# Patient Record
Sex: Female | Born: 1970 | Race: White | Hispanic: No | Marital: Married | Smoking: Never smoker
Health system: Southern US, Community
[De-identification: ages and names within clinical notes are randomized; demographics above are authoritative.]

## PROBLEM LIST (undated history)

## (undated) ENCOUNTER — Ambulatory Visit (HOSPITAL_COMMUNITY): Discharge status: Absent without leave | Payer: BLUE CROSS/BLUE SHIELD

## (undated) DIAGNOSIS — A64 Unspecified sexually transmitted disease: Secondary | ICD-10-CM

## (undated) DIAGNOSIS — R32 Unspecified urinary incontinence: Secondary | ICD-10-CM

## (undated) DIAGNOSIS — R011 Cardiac murmur, unspecified: Secondary | ICD-10-CM

## (undated) DIAGNOSIS — F32A Depression, unspecified: Secondary | ICD-10-CM

## (undated) DIAGNOSIS — T7840XA Allergy, unspecified, initial encounter: Secondary | ICD-10-CM

## (undated) DIAGNOSIS — R87619 Unspecified abnormal cytological findings in specimens from cervix uteri: Secondary | ICD-10-CM

## (undated) DIAGNOSIS — I10 Essential (primary) hypertension: Secondary | ICD-10-CM

## (undated) DIAGNOSIS — F329 Major depressive disorder, single episode, unspecified: Secondary | ICD-10-CM

## (undated) HISTORY — DX: Unspecified sexually transmitted disease: A64

## (undated) HISTORY — PX: INTRAUTERINE DEVICE INSERTION: SHX323

## (undated) HISTORY — PX: APPENDECTOMY: SHX54

## (undated) HISTORY — DX: Essential (primary) hypertension: I10

## (undated) HISTORY — DX: Depression, unspecified: F32.A

## (undated) HISTORY — DX: Major depressive disorder, single episode, unspecified: F32.9

## (undated) HISTORY — PX: DILATION AND CURETTAGE OF UTERUS: SHX78

## (undated) HISTORY — PX: TONSILLECTOMY AND ADENOIDECTOMY: SUR1326

## (undated) HISTORY — DX: Unspecified urinary incontinence: R32

## (undated) HISTORY — PX: BREAST EXCISIONAL BIOPSY: SUR124

## (undated) HISTORY — DX: Allergy, unspecified, initial encounter: T78.40XA

## (undated) HISTORY — DX: Cardiac murmur, unspecified: R01.1

## (undated) HISTORY — DX: Unspecified abnormal cytological findings in specimens from cervix uteri: R87.619

## (undated) HISTORY — PX: TYMPANOSTOMY TUBE PLACEMENT: SHX32

---

## 1998-04-17 HISTORY — PX: BREAST EXCISIONAL BIOPSY: SUR124

## 2007-09-27 ENCOUNTER — Ambulatory Visit (HOSPITAL_BASED_OUTPATIENT_CLINIC_OR_DEPARTMENT_OTHER): Admission: RE | Admit: 2007-09-27 | Discharge: 2007-09-27 | Payer: Self-pay | Admitting: Family Medicine

## 2007-10-05 ENCOUNTER — Ambulatory Visit: Payer: Self-pay | Admitting: Internal Medicine

## 2008-07-27 ENCOUNTER — Ambulatory Visit (HOSPITAL_COMMUNITY): Admission: EM | Admit: 2008-07-27 | Discharge: 2008-07-29 | Payer: Self-pay | Admitting: Emergency Medicine

## 2008-07-27 ENCOUNTER — Encounter (INDEPENDENT_AMBULATORY_CARE_PROVIDER_SITE_OTHER): Payer: Self-pay | Admitting: Surgery

## 2010-07-27 LAB — DIFFERENTIAL
Basophils Absolute: 0.1 10*3/uL (ref 0.0–0.1)
Basophils Relative: 1 % (ref 0–1)
Basophils Relative: 1 % (ref 0–1)
Eosinophils Absolute: 0 10*3/uL (ref 0.0–0.7)
Lymphocytes Relative: 17 % (ref 12–46)
Lymphs Abs: 1.6 10*3/uL (ref 0.7–4.0)
Monocytes Absolute: 1 10*3/uL (ref 0.1–1.0)
Monocytes Relative: 11 % (ref 3–12)
Monocytes Relative: 7 % (ref 3–12)
Neutro Abs: 10.3 10*3/uL — ABNORMAL HIGH (ref 1.7–7.7)
Neutro Abs: 6.7 10*3/uL (ref 1.7–7.7)
Neutrophils Relative %: 71 % (ref 43–77)
Neutrophils Relative %: 85 % — ABNORMAL HIGH (ref 43–77)

## 2010-07-27 LAB — CBC
Hemoglobin: 10.7 g/dL — ABNORMAL LOW (ref 12.0–15.0)
MCHC: 34.2 g/dL (ref 30.0–36.0)
MCHC: 34.5 g/dL (ref 30.0–36.0)
MCV: 90.6 fL (ref 78.0–100.0)
Platelets: 289 10*3/uL (ref 150–400)
RBC: 3.44 MIL/uL — ABNORMAL LOW (ref 3.87–5.11)
RBC: 3.49 MIL/uL — ABNORMAL LOW (ref 3.87–5.11)
WBC: 9.5 10*3/uL (ref 4.0–10.5)

## 2010-07-27 LAB — PREGNANCY, URINE: Preg Test, Ur: NEGATIVE

## 2010-07-27 LAB — HEMOGLOBIN AND HEMATOCRIT, BLOOD: Hemoglobin: 12.5 g/dL (ref 12.0–15.0)

## 2010-08-30 NOTE — Op Note (Signed)
NAMESAMAN, Sara Potter               ACCOUNT NO.:  [id number]   MEDICAL RECORD NO.:  [id number]          PATIENT TYPE:  INP   LOCATION:                               FACILITY:  [facility]   PHYSICIAN:  Thornton Park. Daphine Deutscher, MD  DATE OF BIRTH:  [birth year]   DATE OF PROCEDURE:  DATE OF DISCHARGE:                               OPERATIVE REPORT   PREOPERATIVE DIAGNOSIS:  Acute appendicitis.   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   PROCEDURE:  Laparoscopic appendectomy.   SURGEON:  Thornton Park. Daphine Deutscher, M.D.   ANESTHESIA:  General endotracheal.   DESCRIPTION OF PROCEDURE:  The patient was met in the [facility]  Emergency Room after a CT scan at [facility] demonstrated  appendicitis with a 13 mm appendix.  In talking with her husband  postoperatively, however, she has had pain for about a month and it got  worse this weekend.   She was taken to OR 1, given general anesthesia.  Preoperatively, she  received Mefoxin.  The abdomen was prepped and draped sterilely.  I  entered the abdomen, going into the umbilicus without difficulty.  Hasson cannula was used and then a 5 mm was placed in the right upper  quadrant and a 11 in the left lower quadrant placed very obliquely.  The  appendix was quite unrecognizable and looked to be coming off and  twisting around and was extraperitoneal in some ways.  I identified the  tip and teased it away and identified the terminal ileum, used those as  landmarks, and then gradually worked my way through this inflamed  mesentery using a harmonic until I got down and realized that the stump  of the appendix had ruptured and was basically in 2.  I went ahead and  fired the stapler to resect it out but realized that the stump was  actually more proximal and it had basically ruptured at the base.  I,  therefore, resected the base of the appendix with 2 applications of the  Endo-GIA using blue loads.  Bleeding was  controlled.  I put the piece of  the appendix in a bag and brought them out and went in and washed and  irrigated.  No bleeding was noted.  I went ahead and placed a drain in  the pelvis and along the right gutter.  I removed most of my irrigant  and then closed the umbilical port with laparoscopic vision with 3  sutures of 0 Vicryl and used 4-0 Vicryl in the left lower quadrant and  then I injected with some Marcaine.  The patient tolerated the procedure  well and was taken to the recovery room in satisfactory condition.      Thornton Park Daphine Deutscher, MD  Electronically Signed    MBM/MEDQ  D:  07/27/2008  T:  07/27/2008  Job:  [id number]   cc:   Tammy R. Collins Scotland, M.D.  Fax: [phone]

## 2010-08-30 NOTE — Procedures (Signed)
Sara Potter, Sara Potter NO.:  [id number]   MEDICAL RECORD NO.:  [id number]          PATIENT TYPE:  OUT   LOCATION:  SLEEP CENTER                 FACILITY:  [facility]   PHYSICIAN:  Clinton D. Maple Hudson, MD, FCCP, FACPDATE OF BIRTH:  05-Oct-1969   DATE OF STUDY:  09/27/2007                            NOCTURNAL POLYSOMNOGRAM   REFERRING PHYSICIAN:  Tammy R. Collins Scotland, M.D.   INDICATION FOR STUDY:  Hypersomnia with sleep apnea.   EPWORTH SLEEPINESS SCORE:  3/24.  BMI 25.6.  weight 140 pounds.  Height  62 inches.  Neck 13.5 inches.   MEDICATIONS:  Home medication charted and reviewed.   SLEEP ARCHITECTURE:  Total sleep time 334 minutes with sleep efficiency  84.9%.  Stage I was 1.8%, stage II 85.9%, stage III 2.8%, REM 9.4% of  total sleep time.  Sleep latency 34.5%, REM latency 221 minutes.  Awake  after sleep onset 26 minutes.  Arousal index 7.  Strattera was taken at  8:30 p.m.   RESPIRATORY DATA:  Apnea-hypopnea index (AHI) 1.6 per hour, which is  normal.  Nine events were scored, all hypopneas.  Nonpositional.   OXYGEN DATA:  Mild snoring with oxygen desaturation to a nadir of 92%.  Mean oxygen saturation through the study was 96.2% on room air.   CARDIAC DATA:  Sinus rhythm.   MOVEMENT-PARASOMNIA:  Periodic limb movement with 4 events counted,  affecting sleep, index 0.7 per hour.  The patient wore a bite guard for  bruxism.  She slept only supine and says this is typical at home.  No  bathroom trips.   IMPRESSIONS-RECOMMENDATIONS:  1. Sleep architecture was remarkable only for sustained time until      about 2:30, marked by dominant stage II pattern with only brief      sleep stage changes.  The appearance would suggest medication      effect.  Strattera can be associated with sleep disturbances      including insomnia and vivid dreams.  2. Insignificant respiratory sleep disturbance, apnea-hypopnea index      1.6 per hour, which is normal (normal range 0-5 per  hour).  Mild      snoring with oxygen desaturation to a nadir of 92%.  3. Use of bite guard against bruxism.  4. Minimal periodic limb movement, index 0.7 arousals per hour.      Clinton D. Maple Hudson, MD, [facility], FACP  Diplomate, Biomedical engineer of Sleep Medicine  Electronically Signed     CDY/MEDQ  D:  10/05/2007 09:11:13  T:  10/05/2007 16:10:96  Job:  [id number]

## 2012-08-16 DIAGNOSIS — E559 Vitamin D deficiency, unspecified: Secondary | ICD-10-CM | POA: Insufficient documentation

## 2013-04-22 ENCOUNTER — Encounter: Payer: Self-pay | Admitting: Certified Nurse Midwife

## 2013-05-06 ENCOUNTER — Ambulatory Visit (INDEPENDENT_AMBULATORY_CARE_PROVIDER_SITE_OTHER): Payer: 59 | Admitting: Certified Nurse Midwife

## 2013-05-06 ENCOUNTER — Encounter: Payer: Self-pay | Admitting: Certified Nurse Midwife

## 2013-05-06 VITALS — BP 118/68 | HR 84 | Resp 20 | Ht 61.5 in | Wt 126.0 lb

## 2013-05-06 DIAGNOSIS — Z0000 Encounter for general adult medical examination without abnormal findings: Secondary | ICD-10-CM

## 2013-05-06 DIAGNOSIS — Z01419 Encounter for gynecological examination (general) (routine) without abnormal findings: Secondary | ICD-10-CM

## 2013-05-06 LAB — POCT URINALYSIS DIPSTICK
Bilirubin, UA: NEGATIVE
Blood, UA: NEGATIVE
Glucose, UA: NEGATIVE
Ketones, UA: NEGATIVE
Leukocytes, UA: NEGATIVE
Nitrite, UA: NEGATIVE
Protein, UA: NEGATIVE
Urobilinogen, UA: NEGATIVE
pH, UA: 8

## 2013-05-06 NOTE — Progress Notes (Signed)
43 y.o. G0P0000 Married Caucasian Fe here to establishment gyn care and for annual exam.Periods occasional scant amount, with Mirena IUD. Inserted 3/13(second one) Happy with choice for contraception. Sees PCP for all labs and Vitamin D management and Hypertension. Sees MD for depression medication management. Strong family history of osteoporosis, so patient works very hard at exercise and good dietary intake of calcium. No health issues today.  Patient's last menstrual period was 02/16/2007.          Sexually active: yes  The current method of family planning is IUD.    Exercising: yes  yin yoga,weights, rowing, advanced yoga, jump rope, stair step & bike Smoker:  no  Health Maintenance: Pap:  2012 normal per patient MMG:  12/14 normal Colonoscopy:  none BMD:   none TDaP:  Unsure per patient if update? Labs: Poct urine-ph 8.0 Self breast exam: not done   reports that she has never smoked. She has never used smokeless tobacco. She reports that she drinks about 2.0 ounces of alcohol per week. She reports that she does not use illicit drugs.  Past Medical History  Diagnosis Date  . Depression   . Hypertension   . STD (sexually transmitted disease)     chlamydia age 37  . Urinary incontinence     Past Surgical History  Procedure Laterality Date  . Breast lumpectomy      age 28, age 57 (neg)  . Dilation and curettage of uterus      age 69  . Tonsillectomy and adenoidectomy    . Tympanostomy tube placement    . Appendectomy    . Intrauterine device insertion      2nd mirena inserted 3/13    Current Outpatient Prescriptions  Medication Sig Dispense Refill  . lisinopril-hydrochlorothiazide (PRINZIDE,ZESTORETIC) 10-12.5 MG per tablet daily.      . sertraline (ZOLOFT) 50 MG tablet daily.      Marland Kitchen STRATTERA 60 MG capsule daily.       No current facility-administered medications for this visit.    Family History  Problem Relation Age of Onset  . Hypertension Mother   .  Osteoporosis Mother   . Cancer Sister     ? unsure if she does  . Hypertension Sister   . Heart attack Maternal Grandfather   . Osteoporosis Maternal Aunt   . Osteoporosis Maternal Grandmother   . Osteoporosis Paternal Grandmother     ROS:  Pertinent items are noted in HPI.  Otherwise, a comprehensive ROS was negative.  Exam:   BP 118/68  Pulse 84  Resp 20  Ht 5' 1.5" (1.562 m)  Wt 126 lb (57.153 kg)  BMI 23.42 kg/m2  LMP 02/16/2007 Height: 5' 1.5" (156.2 cm)  Ht Readings from Last 3 Encounters:  05/06/13 5' 1.5" (1.562 m)    General appearance: alert, cooperative and appears stated age Head: Normocephalic, without obvious abnormality, atraumatic Neck: no adenopathy, supple, symmetrical, trachea midline and thyroid normal to inspection and palpation Lungs: clear to auscultation bilaterally Breasts: normal appearance, no masses or tenderness, No nipple retraction or dimpling, No nipple discharge or bleeding, No axillary or supraclavicular adenopathy Heart: regular rate and rhythm Abdomen: soft, non-tender; no masses,  no organomegaly Extremities: extremities normal, atraumatic, no cyanosis or edema Skin: Skin color, texture, turgor normal. No rashes or lesions Lymph nodes: Cervical, supraclavicular, and axillary nodes normal. No abnormal inguinal nodes palpated Neurologic: Grossly normal   Pelvic: External genitalia:  no lesions  Urethra:  normal appearing urethra with no masses, tenderness or lesions              Bartholin's and Skene's: normal                 Vagina: normal appearing vagina with normal color and discharge, no lesions              Cervix: normal, non tender, IUD string noted              Pap taken: yes Bimanual Exam:  Uterus:  normal size, contour, position, consistency, mobility, non-tender and anteverted              Adnexa: normal adnexa and no mass, fullness, tenderness               Rectovaginal: Confirms               Anus:  normal  sphincter tone, no lesions  A:  Well Woman with normal exam  Contraception Mirena IUD inserted 3/13  Depression on stable medication with MD  Hypertension on stable medication with MD  P:   Reviewed health and wellness pertinent to exam  Aware of IUD warning signs and symptoms, Removal due 06/2016  Pap smear as per guidelines   Mammogram yearly pap smear taken today with HPVHR  counseled on breast self exam, mammography screening, adequate intake of calcium and vitamin D, diet and exercise, Kegel's exercises  return annually or prn  An After Visit Summary was printed and given to the patient.

## 2013-05-06 NOTE — Patient Instructions (Signed)

## 2013-05-06 NOTE — Progress Notes (Signed)
Reviewed personally.  M. Suzanne Tyris Eliot, MD.  

## 2013-05-08 LAB — IPS PAP TEST WITH HPV

## 2014-05-11 ENCOUNTER — Ambulatory Visit: Payer: 59 | Admitting: Certified Nurse Midwife

## 2014-07-17 ENCOUNTER — Ambulatory Visit: Payer: Self-pay | Admitting: Certified Nurse Midwife

## 2014-08-14 DIAGNOSIS — F902 Attention-deficit hyperactivity disorder, combined type: Secondary | ICD-10-CM | POA: Insufficient documentation

## 2014-09-25 ENCOUNTER — Encounter: Payer: Self-pay | Admitting: Certified Nurse Midwife

## 2014-09-25 ENCOUNTER — Ambulatory Visit (INDEPENDENT_AMBULATORY_CARE_PROVIDER_SITE_OTHER): Payer: 59 | Admitting: Certified Nurse Midwife

## 2014-09-25 VITALS — BP 118/86 | HR 110 | Resp 16 | Ht 61.5 in | Wt 125.0 lb

## 2014-09-25 DIAGNOSIS — N63 Unspecified lump in breast: Secondary | ICD-10-CM | POA: Diagnosis not present

## 2014-09-25 DIAGNOSIS — Z01419 Encounter for gynecological examination (general) (routine) without abnormal findings: Secondary | ICD-10-CM | POA: Diagnosis not present

## 2014-09-25 DIAGNOSIS — R319 Hematuria, unspecified: Secondary | ICD-10-CM

## 2014-09-25 DIAGNOSIS — Z23 Encounter for immunization: Secondary | ICD-10-CM | POA: Diagnosis not present

## 2014-09-25 DIAGNOSIS — N6320 Unspecified lump in the left breast, unspecified quadrant: Secondary | ICD-10-CM

## 2014-09-25 LAB — POCT URINALYSIS DIPSTICK
Bilirubin, UA: NEGATIVE
Glucose, UA: NEGATIVE
Ketones, UA: NEGATIVE
Leukocytes, UA: NEGATIVE
Nitrite, UA: NEGATIVE
Protein, UA: NEGATIVE
Spec Grav, UA: 1.02
UROBILINOGEN UA: NEGATIVE
pH, UA: 6.5

## 2014-09-25 NOTE — Progress Notes (Signed)
Patient ID: Sara Potter, female   DOB: 05/26/1970, 44 y.o.   MRN: [id number] 44 y.o. G0P0000 Married  Caucasian Fe here for annual exam. Periods none with Mirena IUD. Insertion date 3/13. Happy with choice. Sees PCP for aex/labs yearly. Sees Dr. Curt Jews manages medication which is stable.  Occasional urinary leakage with increase water intake. Denies any urinary symptoms otherwise. No other health issues today.  No LMP recorded. Patient is not currently having periods (Reason: IUD).          Sexually active: Yes.    The current method of family planning is IUD.    Exercising: Yes.    biking, jump rope, strength building, yoga, weights, row machine Smoker:  no  Health Maintenance: Pap:  04-26-13 WNL NEG HR HPV MMG:  04-04-13 WNL (Care Everywhere)  [facility]  Colonoscopy:  Never BMD:   Never   TDaP:  01-2001 Labs: PCP does labs Self Breast Exams: patient does monthly self breast exam    reports that she has never smoked. She has never used smokeless tobacco. She reports that she drinks about 2.0 - 3.5 oz of alcohol per week. She reports that she does not use illicit drugs.  Past Medical History  Diagnosis Date  . Depression   . Hypertension   . STD (sexually transmitted disease)     chlamydia age 25  . Urinary incontinence     Past Surgical History  Procedure Laterality Date  . Breast lumpectomy      age 65, age 44 (neg)  . Dilation and curettage of uterus      age 13  . Tonsillectomy and adenoidectomy    . Tympanostomy tube placement    . Appendectomy    . Intrauterine device insertion      2nd mirena inserted 3/13    Current Outpatient Prescriptions  Medication Sig Dispense Refill  . STRATTERA 60 MG capsule daily.     No current facility-administered medications for this visit.    Family History  Problem Relation Age of Onset  . Hypertension Mother   . Osteoporosis Mother   . Cancer Sister     ? unsure if she does  . Hypertension Sister   . Heart attack Maternal  Grandfather   . Osteoporosis Maternal Aunt   . Osteoporosis Maternal Grandmother   . Osteoporosis Paternal Grandmother     ROS:  Pertinent items are noted in HPI.  Otherwise, a comprehensive ROS was negative.  Exam:   BP 118/86 mmHg  Pulse 110  Resp 16  Ht 5' 1.5" (1.562 m)  Wt 125 lb (56.7 kg)  BMI 23.24 kg/m2 Height: 5' 1.5" (156.2 cm) Ht Readings from Last 3 Encounters:  09/25/14 5' 1.5" (1.562 m)  05/06/13 5' 1.5" (1.562 m)    General appearance: alert, cooperative and appears stated age Head: Normocephalic, without obvious abnormality, atraumatic Neck: no adenopathy, supple, symmetrical, trachea midline and thyroid normal to inspection and palpation Lungs: clear to auscultation bilaterally Breasts: normal appearance, no masses or tenderness, No nipple retraction or dimpling, No nipple discharge or bleeding, No axillary or supraclavicular adenopathy, positive findings: small moveable mass noted at aerola edge at 1 o'clock on left breast, non tender, mobile.  Both breast have very shotty feel. Heart: regular rate and rhythm Abdomen: soft, non-tender; no masses,  no organomegaly Extremities: extremities normal, atraumatic, no cyanosis or edema Skin: Skin color, texture, turgor normal. No rashes or lesions Lymph nodes: Cervical, supraclavicular, and axillary nodes normal. No abnormal inguinal nodes palpated  Neurologic: Grossly normal   Pelvic: External genitalia:  no lesions              Urethra:  normal appearing urethra with no masses, tenderness or lesions              Bartholin's and Skene's: normal                 Vagina: normal appearing vagina with normal color and discharge, no lesions              Cervix: normal,no lesions,non tender, IUD string noted              Pap taken: No. Bimanual Exam:  Uterus:  normal size, contour, position, consistency, mobility, non-tender              Adnexa: normal adnexa and no mass, fullness, tenderness               Rectovaginal:  Confirms               Anus:  normal sphincter tone, no lesions  Chaperone present: Yes  A:  Well Woman with normal exam  Contraception MIrena IUD due for removal on 3/19  Left breast mass  R/O UTI  Immunization update   P:   Reviewed health and wellness pertinent to exam  Aware of IUD warning signs and need to advise  Discussed left breast finding and need for evaluation with Diagnostic mammogram and [place]. Patient agreeable. Will schedule.  Reviewed warning signs of UTI and need to advise.  Lab Urine micro  Requests TDAP  Pap smear not taken today.   counseled on breast self exam, mammography screening, adequate intake of calcium and vitamin D, diet and exercise, Kegel's exercises  return annually or prn  An After Visit Summary was printed and given to the patient.

## 2014-09-25 NOTE — Patient Instructions (Signed)

## 2014-09-25 NOTE — Progress Notes (Signed)
Scheduled patient for bilateral diagnostic mammogram with left breast ultrasound at [facility] on 6/24 at 3pm.

## 2014-09-26 LAB — URINALYSIS, MICROSCOPIC ONLY
BACTERIA UA: NONE SEEN
CRYSTALS: NONE SEEN
Casts: NONE SEEN
Squamous Epithelial / LPF: NONE SEEN

## 2014-09-27 NOTE — Progress Notes (Signed)
Reviewed personally.  M. Suzanne Julienne Vogler, MD.  

## 2014-10-09 ENCOUNTER — Other Ambulatory Visit: Payer: Self-pay

## 2014-10-30 ENCOUNTER — Ambulatory Visit
Admission: RE | Admit: 2014-10-30 | Discharge: 2014-10-30 | Disposition: A | Discharge status: Home / Self Care | Payer: 59 | Source: Ambulatory Visit | Attending: Certified Nurse Midwife | Admitting: Certified Nurse Midwife

## 2014-10-30 DIAGNOSIS — N6320 Unspecified lump in the left breast, unspecified quadrant: Secondary | ICD-10-CM

## 2014-11-09 ENCOUNTER — Telehealth: Payer: Self-pay | Admitting: Emergency Medicine

## 2014-11-09 NOTE — Telephone Encounter (Signed)
   Notes Recorded by Kem Boroughs, FNP on 10/30/2014 at 12:52 PM May take out of hold after MD review - but needs a follow up breast check in 1 month with Ms. Jackelyn Poling

## 2014-11-09 NOTE — Telephone Encounter (Signed)
Message left to return call to Kanita Delage at [phone].    

## 2014-11-09 NOTE — Telephone Encounter (Signed)
Patient returned call and she is given message from provider. She is agreeable to scheduling follow up breast check and scheduled for 12/04/14 at 1600.  Routing to provider for final review. Patient agreeable to disposition. Will close encounter.

## 2014-12-04 ENCOUNTER — Encounter: Payer: Self-pay | Admitting: Certified Nurse Midwife

## 2014-12-04 ENCOUNTER — Ambulatory Visit (INDEPENDENT_AMBULATORY_CARE_PROVIDER_SITE_OTHER): Payer: 59 | Admitting: Certified Nurse Midwife

## 2014-12-04 VITALS — BP 110/70 | HR 68 | Resp 16 | Ht 61.5 in | Wt 126.0 lb

## 2014-12-04 DIAGNOSIS — Z1239 Encounter for other screening for malignant neoplasm of breast: Secondary | ICD-10-CM | POA: Diagnosis not present

## 2014-12-04 NOTE — Progress Notes (Signed)
   Subjective:   44 y.o. MarriedCaucasian female presents for follow up evaluation of left breast  which on breast exam by me noted small questionable cystic feel mass on left breast. Patient had diagnostic mammogram and [place] which no abnormal findings were seen. Patient started Evening Primrose and Vitamin E after exam and she did not it again prior to imaging. She has not any masses in either breast now.   Review of Systems Pertinent items are noted in HPI.   Objective:   General appearance: alert, cooperative and appears stated age Breasts: normal appearance, no masses or tenderness, No nipple retraction or dimpling, No nipple discharge or bleeding, No axillary or supraclavicular adenopathy, no mass noted in either breast today   Assessment:   ASSESSMENT:Patient is diagnosed with normal breast exam today with probable cyst noted at last exam resolved.   Plan:   PLAN: The patient will continue SBE and advise if any changes.

## 2014-12-04 NOTE — Progress Notes (Signed)
Reviewed personally.  M. Suzanne Nimra Puccinelli, MD.  

## 2015-08-06 DIAGNOSIS — F909 Attention-deficit hyperactivity disorder, unspecified type: Secondary | ICD-10-CM | POA: Diagnosis not present

## 2015-08-27 DIAGNOSIS — F909 Attention-deficit hyperactivity disorder, unspecified type: Secondary | ICD-10-CM | POA: Diagnosis not present

## 2015-09-02 ENCOUNTER — Other Ambulatory Visit (INDEPENDENT_AMBULATORY_CARE_PROVIDER_SITE_OTHER): Payer: BLUE CROSS/BLUE SHIELD

## 2015-09-02 ENCOUNTER — Ambulatory Visit (INDEPENDENT_AMBULATORY_CARE_PROVIDER_SITE_OTHER): Payer: BLUE CROSS/BLUE SHIELD | Admitting: Internal Medicine

## 2015-09-02 ENCOUNTER — Encounter: Payer: Self-pay | Admitting: Internal Medicine

## 2015-09-02 VITALS — BP 102/74 | HR 64 | Temp 98.2°F | Resp 16 | Ht 62.0 in | Wt 125.0 lb

## 2015-09-02 DIAGNOSIS — Z0000 Encounter for general adult medical examination without abnormal findings: Secondary | ICD-10-CM

## 2015-09-02 LAB — COMPREHENSIVE METABOLIC PANEL
ALT: 13 U/L (ref 0–35)
AST: 15 U/L (ref 0–37)
Albumin: 4.6 g/dL (ref 3.5–5.2)
Alkaline Phosphatase: 39 U/L (ref 39–117)
BUN: 10 mg/dL (ref 6–23)
CHLORIDE: 106 meq/L (ref 96–112)
CO2: 25 mEq/L (ref 19–32)
Calcium: 9.5 mg/dL (ref 8.4–10.5)
Creatinine, Ser: 0.77 mg/dL (ref 0.40–1.20)
GFR: 85.97 mL/min (ref >60.00)
GLUCOSE: 89 mg/dL (ref 70–99)
POTASSIUM: 4.9 meq/L (ref 3.5–5.1)
SODIUM: 139 meq/L (ref 135–145)
Total Bilirubin: 0.6 mg/dL (ref 0.2–1.2)
Total Protein: 6.9 g/dL (ref 6.0–8.3)

## 2015-09-02 LAB — CBC WITH DIFFERENTIAL/PLATELET
BASOS PCT: 1.7 % (ref 0.0–3.0)
Basophils Absolute: 0.1 10*3/uL (ref 0.0–0.1)
EOS PCT: 0.9 % (ref 0.0–5.0)
Eosinophils Absolute: 0 10*3/uL (ref 0.0–0.7)
HCT: 40.4 % (ref 36.0–46.0)
Hemoglobin: 13.8 g/dL (ref 12.0–15.0)
LYMPHS ABS: 1.5 10*3/uL (ref 0.7–4.0)
Lymphocytes Relative: 31.1 % (ref 12.0–46.0)
MCHC: 34.2 g/dL (ref 30.0–36.0)
MCV: 89.8 fl (ref 78.0–100.0)
MONO ABS: 0.4 10*3/uL (ref 0.1–1.0)
Monocytes Relative: 7.4 % (ref 3.0–12.0)
NEUTROS ABS: 2.9 10*3/uL (ref 1.4–7.7)
NEUTROS PCT: 58.9 % (ref 43.0–77.0)
Platelets: 322 10*3/uL (ref 150.0–400.0)
RBC: 4.5 Mil/uL (ref 3.87–5.11)
RDW: 13.4 % (ref 11.5–15.5)
WBC: 4.9 10*3/uL (ref 4.0–10.5)

## 2015-09-02 LAB — LIPID PANEL
CHOLESTEROL: 183 mg/dL (ref 0–200)
HDL: 74.1 mg/dL (ref >39.00)
LDL CALC: 97 mg/dL (ref 0–99)
NONHDL: 108.87
Total CHOL/HDL Ratio: 2
Triglycerides: 59 mg/dL (ref 0.0–149.0)
VLDL: 11.8 mg/dL (ref 0.0–40.0)

## 2015-09-02 LAB — TSH: TSH: 2.24 u[IU]/mL (ref 0.35–4.50)

## 2015-09-02 NOTE — Progress Notes (Signed)
Pre visit review using our clinic review tool, if applicable. No additional management support is needed unless otherwise documented below in the visit note. 

## 2015-09-02 NOTE — Patient Instructions (Signed)
Test(s) ordered today. Your results will be released to MyChart (or called to you) after review, usually within 72hours after test completion. If any changes need to be made, you will be notified at that same time.  All other Health Maintenance issues reviewed.   All recommended immunizations and age-appropriate screenings are up-to-date or discussed.  No immunizations administered today.   Medications reviewed and updated.  No changes recommended at this time.     Health Maintenance, Female Adopting a healthy lifestyle and getting preventive care can go a long way to promote health and wellness. Talk with your health care provider about what schedule of regular examinations is right for you. This is a good chance for you to check in with your provider about disease prevention and staying healthy. In between checkups, there are plenty of things you can do on your own. Experts have done a lot of research about which lifestyle changes and preventive measures are most likely to keep you healthy. Ask your health care provider for more information. WEIGHT AND DIET  Eat a healthy diet  Be sure to include plenty of vegetables, fruits, low-fat dairy products, and lean protein.  Do not eat a lot of foods high in solid fats, added sugars, or salt.  Get regular exercise. This is one of the most important things you can do for your health.  Most adults should exercise for at least 150 minutes each week. The exercise should increase your heart rate and make you sweat (moderate-intensity exercise).  Most adults should also do strengthening exercises at least twice a week. This is in addition to the moderate-intensity exercise.  Maintain a healthy weight  Body mass index (BMI) is a measurement that can be used to identify possible weight problems. It estimates body fat based on height and weight. Your health care provider can help determine your BMI and help you achieve or maintain a healthy  weight.  For females 20 years of age and older:   A BMI below 18.5 is considered underweight.  A BMI of 18.5 to 24.9 is normal.  A BMI of 25 to 29.9 is considered overweight.  A BMI of 30 and above is considered obese.  Watch levels of cholesterol and blood lipids  You should start having your blood tested for lipids and cholesterol at 46 years of age, then have this test every 5 years.  You may need to have your cholesterol levels checked more often if:  Your lipid or cholesterol levels are high.  You are older than 46 years of age.  You are at high risk for heart disease.  CANCER SCREENING   Lung Cancer  Lung cancer screening is recommended for adults 55-80 years old who are at high risk for lung cancer because of a history of smoking.  A yearly low-dose CT scan of the lungs is recommended for people who:  Currently smoke.  Have quit within the past 15 years.  Have at least a 30-pack-year history of smoking. A pack year is smoking an average of one pack of cigarettes a day for 1 year.  Yearly screening should continue until it has been 15 years since you quit.  Yearly screening should stop if you develop a health problem that would prevent you from having lung cancer treatment.  Breast Cancer  Practice breast self-awareness. This means understanding how your breasts normally appear and feel.  It also means doing regular breast self-exams. Let your health care provider know about any changes, no   matter how small.  If you are in your 20s or 30s, you should have a clinical breast exam (CBE) by a health care provider every 1-3 years as part of a regular health exam.  If you are 40 or older, have a CBE every year. Also consider having a breast X-ray (mammogram) every year.  If you have a family history of breast cancer, talk to your health care provider about genetic screening.  If you are at high risk for breast cancer, talk to your health care provider about  having an MRI and a mammogram every year.  Breast cancer gene (BRCA) assessment is recommended for women who have family members with BRCA-related cancers. BRCA-related cancers include:  Breast.  Ovarian.  Tubal.  Peritoneal cancers.  Results of the assessment will determine the need for genetic counseling and BRCA1 and BRCA2 testing. Cervical Cancer Your health care provider may recommend that you be screened regularly for cancer of the pelvic organs (ovaries, uterus, and vagina). This screening involves a pelvic examination, including checking for microscopic changes to the surface of your cervix (Pap test). You may be encouraged to have this screening done every 3 years, beginning at age 21.  For women ages 30-65, health care providers may recommend pelvic exams and Pap testing every 3 years, or they may recommend the Pap and pelvic exam, combined with testing for human papilloma virus (HPV), every 5 years. Some types of HPV increase your risk of cervical cancer. Testing for HPV may also be done on women of any age with unclear Pap test results.  Other health care providers may not recommend any screening for nonpregnant women who are considered low risk for pelvic cancer and who do not have symptoms. Ask your health care provider if a screening pelvic exam is right for you.  If you have had past treatment for cervical cancer or a condition that could lead to cancer, you need Pap tests and screening for cancer for at least 20 years after your treatment. If Pap tests have been discontinued, your risk factors (such as having a new sexual partner) need to be reassessed to determine if screening should resume. Some women have medical problems that increase the chance of getting cervical cancer. In these cases, your health care provider may recommend more frequent screening and Pap tests. Colorectal Cancer  This type of cancer can be detected and often prevented.  Routine colorectal cancer  screening usually begins at 46 years of age and continues through 46 years of age.  Your health care provider may recommend screening at an earlier age if you have risk factors for colon cancer.  Your health care provider may also recommend using home test kits to check for hidden blood in the stool.  A small camera at the end of a tube can be used to examine your colon directly (sigmoidoscopy or colonoscopy). This is done to check for the earliest forms of colorectal cancer.  Routine screening usually begins at age 50.  Direct examination of the colon should be repeated every 5-10 years through 46 years of age. However, you may need to be screened more often if early forms of precancerous polyps or small growths are found. Skin Cancer  Check your skin from head to toe regularly.  Tell your health care provider about any new moles or changes in moles, especially if there is a change in a mole's shape or color.  Also tell your health care provider if you have a mole that   is larger than the size of a pencil eraser.  Always use sunscreen. Apply sunscreen liberally and repeatedly throughout the day.  Protect yourself by wearing long sleeves, pants, a wide-brimmed hat, and sunglasses whenever you are outside. HEART DISEASE, DIABETES, AND HIGH BLOOD PRESSURE   High blood pressure causes heart disease and increases the risk of stroke. High blood pressure is more likely to develop in:  People who have blood pressure in the high end of the normal range (130-139/85-89 mm Hg).  People who are overweight or obese.  People who are African American.  If you are 18-39 years of age, have your blood pressure checked every 3-5 years. If you are 40 years of age or older, have your blood pressure checked every year. You should have your blood pressure measured twice--once when you are at a hospital or clinic, and once when you are not at a hospital or clinic. Record the average of the two measurements.  To check your blood pressure when you are not at a hospital or clinic, you can use:  An automated blood pressure machine at a pharmacy.  A home blood pressure monitor.  If you are between 55 years and 79 years old, ask your health care provider if you should take aspirin to prevent strokes.  Have regular diabetes screenings. This involves taking a blood sample to check your fasting blood sugar level.  If you are at a normal weight and have a low risk for diabetes, have this test once every three years after 45 years of age.  If you are overweight and have a high risk for diabetes, consider being tested at a younger age or more often. PREVENTING INFECTION  Hepatitis B  If you have a higher risk for hepatitis B, you should be screened for this virus. You are considered at high risk for hepatitis B if:  You were born in a country where hepatitis B is common. Ask your health care provider which countries are considered high risk.  Your parents were born in a high-risk country, and you have not been immunized against hepatitis B (hepatitis B vaccine).  You have HIV or AIDS.  You use needles to inject street drugs.  You live with someone who has hepatitis B.  You have had sex with someone who has hepatitis B.  You get hemodialysis treatment.  You take certain medicines for conditions, including cancer, organ transplantation, and autoimmune conditions. Hepatitis C  Blood testing is recommended for:  Everyone born from 1945 through 1965.  Anyone with known risk factors for hepatitis C. Sexually transmitted infections (STIs)  You should be screened for sexually transmitted infections (STIs) including gonorrhea and chlamydia if:  You are sexually active and are younger than 46 years of age.  You are older than 46 years of age and your health care provider tells you that you are at risk for this type of infection.  Your sexual activity has changed since you were last screened and  you are at an increased risk for chlamydia or gonorrhea. Ask your health care provider if you are at risk.  If you do not have HIV, but are at risk, it may be recommended that you take a prescription medicine daily to prevent HIV infection. This is called pre-exposure prophylaxis (PrEP). You are considered at risk if:  You are sexually active and do not regularly use condoms or know the HIV status of your partner(s).  You take drugs by injection.  You are sexually active   with a partner who has HIV. Talk with your health care provider about whether you are at high risk of being infected with HIV. If you choose to begin PrEP, you should first be tested for HIV. You should then be tested every 3 months for as long as you are taking PrEP.  PREGNANCY   If you are premenopausal and you may become pregnant, ask your health care provider about preconception counseling.  If you may become pregnant, take 400 to 800 micrograms (mcg) of folic acid every day.  If you want to prevent pregnancy, talk to your health care provider about birth control (contraception). OSTEOPOROSIS AND MENOPAUSE   Osteoporosis is a disease in which the bones lose minerals and strength with aging. This can result in serious bone fractures. Your risk for osteoporosis can be identified using a bone density scan.  If you are 85 years of age or older, or if you are at risk for osteoporosis and fractures, ask your health care provider if you should be screened.  Ask your health care provider whether you should take a calcium or vitamin D supplement to lower your risk for osteoporosis.  Menopause may have certain physical symptoms and risks.  Hormone replacement therapy may reduce some of these symptoms and risks. Talk to your health care provider about whether hormone replacement therapy is right for you.  HOME CARE INSTRUCTIONS   Schedule regular health, dental, and eye exams.  Stay current with your immunizations.   Do  not use any tobacco products including cigarettes, chewing tobacco, or electronic cigarettes.  If you are pregnant, do not drink alcohol.  If you are breastfeeding, limit how much and how often you drink alcohol.  Limit alcohol intake to no more than 1 drink per day for nonpregnant women. One drink equals 12 ounces of beer, 5 ounces of wine, or 1 ounces of hard liquor.  Do not use street drugs.  Do not share needles.  Ask your health care provider for help if you need support or information about quitting drugs.  Tell your health care provider if you often feel depressed.  Tell your health care provider if you have ever been abused or do not feel safe at home.   This information is not intended to replace advice given to you by your health care provider. Make sure you discuss any questions you have with your health care provider.   Document Released: 10/17/2010 Document Revised: 04/24/2014 Document Reviewed: 03/05/2013 Elsevier Interactive Patient Education [employer].

## 2015-09-02 NOTE — Progress Notes (Signed)
Subjective:    Patient ID: Sara Potter, female    DOB: 02/05/1971, 45 y.o.   MRN: [id number]  HPI She is here to establish with a new pcp.  She is here for a physical exam.   She has no concerns or questions.   Medications and allergies reviewed with patient and updated if appropriate.  There are no active problems to display for this patient.   No current outpatient prescriptions on file prior to visit.   No current facility-administered medications on file prior to visit.    Past Medical History  Diagnosis Date  . Depression   . Hypertension   . STD (sexually transmitted disease)     chlamydia age 45  . Urinary incontinence     Past Surgical History  Procedure Laterality Date  . Breast lumpectomy      age 50, age 72 (neg)  . Dilation and curettage of uterus      age 53  . Tonsillectomy and adenoidectomy    . Tympanostomy tube placement    . Appendectomy    . Intrauterine device insertion      2nd mirena inserted 3/13    Social History   Social History  . Marital Status: Married    Spouse Name: N/A  . Number of Children: N/A  . Years of Education: N/A   Social History Main Topics  . Smoking status: Never Smoker   . Smokeless tobacco: Never Used  . Alcohol Use: 2.0 - 3.5 oz/week    4-7 drink(s) per week  . Drug Use: No  . Sexual Activity:    Partners: Female, Female    Birth Control/ Protection: IUD     Comment: married to female   Other Topics Concern  . None   Social History Narrative    Family History  Problem Relation Age of Onset  . Hypertension Mother   . Osteoporosis Mother   . Cancer Sister     ? unsure if she does  . Hypertension Sister   . Heart attack Maternal Grandfather   . Osteoporosis Maternal Aunt   . Osteoporosis Maternal Grandmother   . Osteoporosis Paternal Grandmother     Review of Systems  Constitutional: Negative for fever, chills, appetite change, fatigue and unexpected weight change.  HENT: Positive for  hearing loss (mild ).   Eyes: Negative for visual disturbance.  Respiratory: Negative for cough, shortness of breath and wheezing.   Cardiovascular: Negative for chest pain, palpitations and leg swelling.  Gastrointestinal: Negative for nausea, abdominal pain, diarrhea and constipation.       Occ gerd  Genitourinary: Negative for dysuria and hematuria.  Musculoskeletal: Negative for myalgias, back pain and arthralgias.  Skin: Negative for color change and rash.  Neurological: Negative for dizziness, light-headedness and headaches.  Psychiatric/Behavioral: Negative for dysphoric mood. The patient is not nervous/anxious.        Objective:   Filed Vitals:   09/02/15 0910  BP: 102/74  Pulse: 64  Temp: 98.2 F (36.8 C)  Resp: 16   Filed Weights   09/02/15 0910  Weight: 125 lb (56.7 kg)   Body mass index is 22.86 kg/(m^2).   Physical Exam Constitutional: She appears well-developed and well-nourished. No distress.  HENT:  Head: Normocephalic and atraumatic.  Right Ear: External ear normal. Normal ear canal and TM Left Ear: External ear normal.  Normal ear canal and TM Mouth/Throat: Oropharynx is clear and moist.  Eyes: Conjunctivae and EOM are normal.  Neck: Neck  supple. No tracheal deviation present. No thyromegaly present.  No carotid bruit  Cardiovascular: Normal rate, regular rhythm and normal heart sounds.   No murmur heard.  No edema. Pulmonary/Chest: Effort normal and breath sounds normal. No respiratory distress. She has no wheezes. She has no rales.  Breast: deferred to Gyn Abdominal: Soft. She exhibits no distension. There is no tenderness.  Lymphadenopathy: She has no cervical adenopathy.  Skin: Skin is warm and dry. She is not diaphoretic.  Psychiatric: She has a normal mood and affect. Her behavior is normal.       Assessment & Plan:   Physical exam: Screening blood work - ordered Immunizations  Up to date  Mammogram  Up to date  Gyn  Up to  date Exercise  - daily Weight  - normal BMI Skin  - no concerns Substance abuse - no abuse Reviewed vitamins she is taking - will continue daily vitamins  Follow up annually

## 2015-09-03 LAB — HIV ANTIBODY (ROUTINE TESTING W REFLEX): HIV: NONREACTIVE

## 2015-09-24 DIAGNOSIS — F909 Attention-deficit hyperactivity disorder, unspecified type: Secondary | ICD-10-CM | POA: Diagnosis not present

## 2015-10-08 ENCOUNTER — Encounter: Payer: Self-pay | Admitting: Certified Nurse Midwife

## 2015-10-08 ENCOUNTER — Ambulatory Visit (INDEPENDENT_AMBULATORY_CARE_PROVIDER_SITE_OTHER): Payer: BLUE CROSS/BLUE SHIELD | Admitting: Certified Nurse Midwife

## 2015-10-08 VITALS — BP 100/60 | HR 80 | Resp 20 | Ht 61.75 in | Wt 125.0 lb

## 2015-10-08 DIAGNOSIS — Z0000 Encounter for general adult medical examination without abnormal findings: Secondary | ICD-10-CM

## 2015-10-08 DIAGNOSIS — Z124 Encounter for screening for malignant neoplasm of cervix: Secondary | ICD-10-CM | POA: Diagnosis not present

## 2015-10-08 DIAGNOSIS — Z01419 Encounter for gynecological examination (general) (routine) without abnormal findings: Secondary | ICD-10-CM

## 2015-10-08 LAB — POCT URINALYSIS DIPSTICK
Bilirubin, UA: NEGATIVE
Blood, UA: NEGATIVE
Glucose, UA: NEGATIVE
Ketones, UA: NEGATIVE
LEUKOCYTES UA: NEGATIVE
NITRITE UA: NEGATIVE
PH UA: 5
PROTEIN UA: NEGATIVE
UROBILINOGEN UA: NEGATIVE

## 2015-10-08 NOTE — Patient Instructions (Signed)

## 2015-10-08 NOTE — Progress Notes (Signed)
45 y.o. G0P0000 Married  Caucasian Fe here for annual exam. Periods scant to none with Mirena IUD. Denies any problems or pain with IUD. Saw Dr.Stacy Burns PCP to establish care in 5/17, with lab screening, all normal( in epic). Has not noted any breast changes since [place] and screen last year. Mammogram due 7/17. No health issues today.Planning some vacation.    No LMP recorded. Patient is not currently having periods (Reason: IUD).          Sexually active: Yes.    The current method of family planning is IUD.    Exercising: Yes.    weights,strength building, yoga,rowing,bike Smoker:  no  Health Maintenance: Pap:  04-26-13 neg HPV HR neg MMG:  10-30-14 bilateral & left breast u/s category 2:neg Colonoscopy:  none BMD:   none TDaP:  2016 Shingles: no Pneumonia: no Hep C and HIV: HIV neg 2017 Labs: poct urine-neg Self breast exam: done monthly   reports that she has never smoked. She has never used smokeless tobacco. She reports that she drinks about 2.4 - 4.2 oz of alcohol per week. She reports that she does not use illicit drugs.  Past Medical History  Diagnosis Date  . Depression   . Hypertension   . STD (sexually transmitted disease)     chlamydia age 45  . Urinary incontinence     Past Surgical History  Procedure Laterality Date  . Dilation and curettage of uterus      age 45  . Tonsillectomy and adenoidectomy    . Tympanostomy tube placement    . Appendectomy    . Intrauterine device insertion      2nd mirena inserted 3/13  . Breast lumpectomy      age 45, age 45 (neg)    No current outpatient prescriptions on file.   No current facility-administered medications for this visit.    Family History  Problem Relation Age of Onset  . Hypertension Mother   . Osteoporosis Mother   . Cancer Sister     ? unsure if she does  . Hypertension Sister   . Heart attack Maternal Grandfather   . Osteoporosis Maternal Aunt   . Osteoporosis Maternal Grandmother   .  Osteoporosis Paternal Grandmother   . Osteopenia Father   . Heart disease Paternal Grandfather     ROS:  Pertinent items are noted in HPI.  Otherwise, a comprehensive ROS was negative.  Exam:   BP 100/60 mmHg  Pulse 80  Resp 20  Ht 5' 1.75" (1.568 m)  Wt 125 lb (56.7 kg)  BMI 23.06 kg/m2 Height: 5' 1.75" (156.8 cm) Ht Readings from Last 3 Encounters:  10/08/15 5' 1.75" (1.568 m)  09/02/15 5\' 2"  (1.575 m)  12/04/14 5' 1.5" (1.562 m)    General appearance: alert, cooperative and appears stated age Head: Normocephalic, without obvious abnormality, atraumatic Neck: no adenopathy, supple, symmetrical, trachea midline and thyroid normal to inspection and palpation Lungs: clear to auscultation bilaterally Breasts: normal appearance, no masses or tenderness, No nipple retraction or dimpling, No nipple discharge or bleeding, No axillary or supraclavicular adenopathy Heart: regular rate and rhythm Abdomen: soft, non-tender; no masses,  no organomegaly Extremities: extremities normal, atraumatic, no cyanosis or edema Skin: Skin color, texture, turgor normal. No rashes or lesions Lymph nodes: Cervical, supraclavicular, and axillary nodes normal. No abnormal inguinal nodes palpated Neurologic: Grossly normal   Pelvic: External genitalia:  no lesions  Urethra:  normal appearing urethra with no masses, tenderness or lesions              Bartholin's and Skene's: normal                 Vagina: normal appearing vagina with normal color and discharge, no lesions              Cervix: no cervical motion tenderness, no lesions and normal with IUD  noted inside cervical os with using pap brush to tease strings down              Pap taken: Yes.   Bimanual Exam:  Uterus:  normal size, contour, position, consistency, mobility, non-tender              Adnexa: normal adnexa and no mass, fullness, tenderness               Rectovaginal: Confirms               Anus:  normal sphincter tone, no  lesions  Chaperone present: yes  A:  Well Woman with normal exam  Contraception Mirena IUD due for removal 3/18, plans exchange with Paula Compton  PCP care with labs  P:   Reviewed health and wellness pertinent to exam  Reviewed warning signs with IUD and need to advise. Discussed calling mid 2/18 to schedule removal and reinsertion of new Mirena. Patient will plan on.  Pap smear as above with HPV reflex   counseled on breast self exam, mammography screening, adequate intake of calcium and vitamin D, diet and exercise  return annually or prn  An After Visit Summary was printed and given to the patient.

## 2015-10-08 NOTE — Progress Notes (Signed)
Reviewed personally.  M. Suzanne Ajooni Karam, MD.  

## 2015-10-11 LAB — IPS PAP TEST WITH REFLEX TO HPV

## 2015-10-18 ENCOUNTER — Other Ambulatory Visit: Payer: Self-pay | Admitting: Certified Nurse Midwife

## 2015-10-18 DIAGNOSIS — Z1231 Encounter for screening mammogram for malignant neoplasm of breast: Secondary | ICD-10-CM

## 2015-10-29 DIAGNOSIS — F900 Attention-deficit hyperactivity disorder, predominantly inattentive type: Secondary | ICD-10-CM | POA: Diagnosis not present

## 2015-11-12 ENCOUNTER — Ambulatory Visit
Admission: RE | Admit: 2015-11-12 | Discharge: 2015-11-12 | Disposition: A | Discharge status: Home / Self Care | Payer: BLUE CROSS/BLUE SHIELD | Source: Ambulatory Visit | Attending: Certified Nurse Midwife | Admitting: Certified Nurse Midwife

## 2015-11-12 DIAGNOSIS — Z1231 Encounter for screening mammogram for malignant neoplasm of breast: Secondary | ICD-10-CM

## 2015-12-10 DIAGNOSIS — F909 Attention-deficit hyperactivity disorder, unspecified type: Secondary | ICD-10-CM | POA: Diagnosis not present

## 2015-12-24 DIAGNOSIS — F909 Attention-deficit hyperactivity disorder, unspecified type: Secondary | ICD-10-CM | POA: Diagnosis not present

## 2016-01-07 DIAGNOSIS — F909 Attention-deficit hyperactivity disorder, unspecified type: Secondary | ICD-10-CM | POA: Diagnosis not present

## 2016-01-15 DIAGNOSIS — Z23 Encounter for immunization: Secondary | ICD-10-CM | POA: Diagnosis not present

## 2016-04-18 ENCOUNTER — Telehealth: Payer: Self-pay | Admitting: Certified Nurse Midwife

## 2016-04-18 DIAGNOSIS — Z30430 Encounter for insertion of intrauterine contraceptive device: Secondary | ICD-10-CM

## 2016-04-18 DIAGNOSIS — Z30432 Encounter for removal of intrauterine contraceptive device: Secondary | ICD-10-CM

## 2016-04-18 MED ORDER — MISOPROSTOL 200 MCG PO TABS: ORAL_TABLET | ORAL | 0 refills | Status: DC

## 2016-04-18 NOTE — Telephone Encounter (Signed)
Spoke with patient. Patient calling to schedule mirena IUD removal and reinsertion. Patient request a Friday appt after 3pm.  Patient scheduled 06/16/16 at 3pm with Dr. Quincy Simmonds. Reviewed and advised of Cytotec 200 mcg: Pace 1 tablet vaginal the night before procedure. Place 1 tablet vaginal the morning of the procedure. Motrin 800 mg with food and water one hour before procedure.Eat and hydrate well. Patient verbalizes understanding and is agreeable.  Orders placed for IUD removal, insertion and cytotec.  Routing to provider for final review. Patient is agreeable to disposition. Will close encounter.  Cc: Melvia Heaps, CNM; Theresia Lo

## 2016-04-18 NOTE — Telephone Encounter (Signed)
Patient is calling to schedule her IUD replacement appointment. Patient said she is due in March 2018 for replacement.

## 2016-04-18 NOTE — Telephone Encounter (Signed)
Left message to call Treyon Wymore at [phone].  

## 2016-05-29 ENCOUNTER — Telehealth: Payer: Self-pay | Admitting: Obstetrics and Gynecology

## 2016-05-29 NOTE — Telephone Encounter (Signed)
Patient requested I call with benefits for IUD removal/reinsertion scheduled 06-16-16. Call to patient to review. Left detailed voicemail per most recent DPR. Left benefits, appointment arrival date and time and 48 hour cancellation policy. Left return call information should patient need to call back.   No further action required. Ok to close.

## 2016-06-15 NOTE — Progress Notes (Signed)
GYNECOLOGY  VISIT   HPI: 46 y.o.   Married  Caucasian  female   G0P0000 with No LMP recorded. Patient is not currently having periods (Reason: IUD).   here for Mirena IUD removal and reinsertion.   Mirena IUD placed 3/13.  This is her third Mirena.  Used Cytotec.  UPT today - negative.   GYNECOLOGIC HISTORY: No LMP recorded. Patient is not currently having periods (Reason: IUD). Contraception:  Mirena IUD Menopausal hormone therapy:  n/a Last mammogram:11-02-15 Density C/Neg/BiRads1:TBC Last pap smear: 10-08-15 Neg;05-06-13 Neg:Neg HR HPV         OB History    Gravida Para Term Preterm AB Living   0 0 0 0 0 0   SAB TAB Ectopic Multiple Live Births   0 0 0 0           Patient Active Problem List   Diagnosis Date Noted  . ADHD (attention deficit hyperactivity disorder), combined type 08/14/2014  . Avitaminosis D 08/16/2012    Past Medical History:  Diagnosis Date  . Depression   . Hypertension   . STD (sexually transmitted disease)    chlamydia age 78  . Urinary incontinence     Past Surgical History:  Procedure Laterality Date  . APPENDECTOMY    . BREAST LUMPECTOMY     age 41, age 76 (neg)  . DILATION AND CURETTAGE OF UTERUS     age 54  . INTRAUTERINE DEVICE INSERTION     2nd mirena inserted 3/13  . TONSILLECTOMY AND ADENOIDECTOMY    . TYMPANOSTOMY TUBE PLACEMENT      Current Outpatient Prescriptions  Medication Sig Dispense Refill  . b complex vitamins tablet Take 1 tablet by mouth daily.    . Calcium 200 MG TABS Take by mouth daily.    . Ergocalciferol (VITAMIN D2) 2000 units TABS Take by mouth daily.    . Flaxseed, Linseed, (FLAX SEED OIL PO) Take by mouth daily.    . IRON PO Take by mouth daily.    . Multiple Vitamin (MULTIVITAMIN) tablet Take 1 tablet by mouth daily.    . Probiotic Product (PROBIOTIC ACIDOPHILUS BEADS PO) Take by mouth daily.     No current facility-administered medications for this visit.      ALLERGIES: Patient has no known  allergies.  Family History  Problem Relation Age of Onset  . Hypertension Mother   . Osteoporosis Mother   . Cancer Sister     ? unsure if she does  . Hypertension Sister   . Heart attack Maternal Grandfather   . Osteoporosis Maternal Aunt   . Osteoporosis Maternal Grandmother   . Osteoporosis Paternal Grandmother   . Osteopenia Father   . Heart disease Paternal Grandfather     Social History   Social History  . Marital status: Married    Spouse name: N/A  . Number of children: N/A  . Years of education: N/A   Occupational History  . Not on file.   Social History Main Topics  . Smoking status: Never Smoker  . Smokeless tobacco: Never Used  . Alcohol use 2.4 - 4.2 oz/week    4 - 7 Standard drinks or equivalent per week  . Drug use: No  . Sexual activity: Yes    Partners: Female, Female    Birth control/ protection: IUD     Comment: married to female   Other Topics Concern  . 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  PHYSICAL EXAMINATION:    BP 92/60 (BP Location: Right Arm, Patient Position: Sitting, Cuff Size: Normal)   Pulse 72   Resp 16   Ht 5' 1.75" (1.568 m)   Wt 125 lb (56.7 kg)   BMI 23.05 kg/m     General appearance: alert, cooperative and appears stated age   Pelvic: External genitalia:  no lesions              Urethra:  normal appearing urethra with no masses, tenderness or lesions              Bartholins and Skenes: normal                 Vagina: normal appearing vagina with normal color and discharge, no lesions              Cervix: no lesions.  IUD strings not visible.                Bimanual Exam:  Uterus:  normal size, contour, position, consistency, mobility, non-tender              Adnexa: no mass, fullness, tenderness     Mirena IUD removal and replacement of new Mirena IUD.  Mirena  lot number- [id number], exp 08/20. Consent for procedures.  Speculum placed.  Sterile prep with Hibiclens. Paracervical block with 1% lidocaine lot [id number], exp 02/21. Tenaculum to anterior cervical lip.  Os finder used to dilate cervix slightly.  Dressing forceps and IUD string finder used to successfully tease the IUD strings through the external os.  Bandage forceps used to remove IUD intact, shown to patient, and discarded.  Uterus sounded to 7.5 cm.  Mirena placed without difficulty. Strings trimmed. Speculum removed.  Bimanual repeated and no change.  Minimal EBL.  No complications.   Chaperone was present for exam.  ASSESSMENT  Mirena IUD removal and replacement of new IUD.  PLAN  Instructions/precautions given.  New IUD card to patient.  Follow up in about 4 weeks for recheck, sooner if needed.   An After Visit Summary was printed and given to the patient.

## 2016-06-16 ENCOUNTER — Ambulatory Visit (INDEPENDENT_AMBULATORY_CARE_PROVIDER_SITE_OTHER): Payer: BLUE CROSS/BLUE SHIELD | Admitting: Obstetrics and Gynecology

## 2016-06-16 VITALS — BP 92/60 | HR 72 | Resp 16 | Ht 61.75 in | Wt 125.0 lb

## 2016-06-16 DIAGNOSIS — Z30430 Encounter for insertion of intrauterine contraceptive device: Secondary | ICD-10-CM

## 2016-06-16 DIAGNOSIS — Z01812 Encounter for preprocedural laboratory examination: Secondary | ICD-10-CM | POA: Diagnosis not present

## 2016-06-16 DIAGNOSIS — Z30432 Encounter for removal of intrauterine contraceptive device: Secondary | ICD-10-CM | POA: Diagnosis not present

## 2016-06-16 LAB — POCT URINE PREGNANCY: Preg Test, Ur: NEGATIVE

## 2016-06-17 ENCOUNTER — Encounter: Payer: Self-pay | Admitting: Obstetrics and Gynecology

## 2016-07-20 NOTE — Progress Notes (Signed)
GYNECOLOGY  VISIT   HPI: 46 y.o.   Married  Caucasian  female   G0P0000 with No LMP recorded. Patient is not currently having periods (Reason: IUD).   here for 4 week follow up after Mirena IUD insertion.   Did well after insertion. No vaginal bleeding or cramping.  Has not felt for IUD strings. No pain with intercourse.   GYNECOLOGIC HISTORY: No LMP recorded. Patient is not currently having periods (Reason: IUD). Contraception:  Mirena IUD inserted 06-16-16 Menopausal hormone therapy:  n/a Last mammogram: 11-02-15 Density C/Neg/BiRads1:TBC Last pap smear:  10-08-15 Neg;05-06-13 Neg:Neg HR HPV         OB History    Gravida Para Term Preterm AB Living   0 0 0 0 0 0   SAB TAB Ectopic Multiple Live Births   0 0 0 0           Patient Active Problem List   Diagnosis Date Noted  . ADHD (attention deficit hyperactivity disorder), combined type 08/14/2014  . Avitaminosis D 08/16/2012    Past Medical History:  Diagnosis Date  . Depression   . Hypertension   . STD (sexually transmitted disease)    chlamydia age 30  . Urinary incontinence     Past Surgical History:  Procedure Laterality Date  . APPENDECTOMY    . BREAST LUMPECTOMY     age 69, age 18 (neg)  . DILATION AND CURETTAGE OF UTERUS     age 79  . INTRAUTERINE DEVICE INSERTION     2nd mirena inserted 3/13  . TONSILLECTOMY AND ADENOIDECTOMY    . TYMPANOSTOMY TUBE PLACEMENT      Current Outpatient Prescriptions  Medication Sig Dispense Refill  . b complex vitamins tablet Take 1 tablet by mouth daily.    . Calcium 200 MG TABS Take by mouth daily.    . Ergocalciferol (VITAMIN D2) 2000 units TABS Take by mouth daily.    . Flaxseed, Linseed, (FLAX SEED OIL PO) Take by mouth daily.    . IRON PO Take by mouth daily.    Marland Kitchen levonorgestrel (MIRENA, 52 MG,) 20 MCG/24HR IUD 1 each by Intrauterine route once.    . Multiple Vitamin (MULTIVITAMIN) tablet Take 1 tablet by mouth daily.    . Probiotic Product (PROBIOTIC ACIDOPHILUS  BEADS PO) Take by mouth daily.     No current facility-administered medications for this visit.      ALLERGIES: Patient has no known allergies.  Family History  Problem Relation Age of Onset  . Hypertension Mother   . Osteoporosis Mother   . Cancer Sister     ? unsure if she does  . Hypertension Sister   . Heart attack Maternal Grandfather   . Osteoporosis Maternal Aunt   . Osteoporosis Maternal Grandmother   . Osteoporosis Paternal Grandmother   . Osteopenia Father   . Heart disease Paternal Grandfather     Social History   Social History  . Marital status: Married    Spouse name: N/A  . Number of children: N/A  . Years of education: N/A   Occupational History  . Not on file.   Social History Main Topics  . Smoking status: Never Smoker  . Smokeless tobacco: Never Used  . Alcohol use 2.4 - 4.2 oz/week    4 - 7 Standard drinks or equivalent per week  . Drug use: No  . Sexual activity: Yes    Partners: Female, Female    Birth control/ protection: IUD  Comment: married to female--Mirena IUD inserted 06-16-16   Other Topics Concern  . 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  PHYSICAL EXAMINATION:    BP 104/62 (BP Location: Right Arm, Patient Position: Sitting, Cuff Size: Normal)   Pulse 66   Ht 5' 1.75" (1.568 m)   Wt 121 lb 6.4 oz (55.1 kg)   BMI 22.38 kg/m     General appearance: alert, cooperative and appears stated age   Pelvic: External genitalia:  no lesions              Urethra:  normal appearing urethra with no masses, tenderness or lesions              Bartholins and Skenes: normal                 Vagina: normal appearing vagina with normal color and discharge, no lesions              Cervix: no lesions.  IUD strings noted.                Bimanual Exam:  Uterus:  normal size, contour,  position, consistency, mobility, non-tender              Adnexa: no mass, fullness, tenderness          Chaperone was present for exam.  ASSESSMENT  Mirena IUD check up.  Doing well. Amenorrhea.   PLAN  Discussion of Mirena IUD.  Removal in 5 years. Can have [place] level checked to determine menopausal status.  Follow up for annual exam with Evalee Mutton and prn.  An After Visit Summary was printed and given to the patient.  __15____ minutes face to face time of which over 50% was spent in counseling.

## 2016-07-21 ENCOUNTER — Ambulatory Visit: Payer: BLUE CROSS/BLUE SHIELD | Admitting: Obstetrics and Gynecology

## 2016-07-24 ENCOUNTER — Encounter: Payer: Self-pay | Admitting: Obstetrics and Gynecology

## 2016-07-24 ENCOUNTER — Ambulatory Visit: Payer: BLUE CROSS/BLUE SHIELD | Admitting: Obstetrics and Gynecology

## 2016-07-24 ENCOUNTER — Ambulatory Visit (INDEPENDENT_AMBULATORY_CARE_PROVIDER_SITE_OTHER): Payer: BLUE CROSS/BLUE SHIELD | Admitting: Obstetrics and Gynecology

## 2016-07-24 VITALS — BP 104/62 | HR 66 | Ht 61.75 in | Wt 121.4 lb

## 2016-07-24 DIAGNOSIS — Z30431 Encounter for routine checking of intrauterine contraceptive device: Secondary | ICD-10-CM | POA: Diagnosis not present

## 2016-08-11 DIAGNOSIS — F909 Attention-deficit hyperactivity disorder, unspecified type: Secondary | ICD-10-CM | POA: Diagnosis not present

## 2016-09-03 NOTE — Progress Notes (Signed)
Subjective:    Patient ID: Sara Potter, female    DOB: 24-Jul-1970, 46 y.o.   MRN: [id number]  HPI She is here for a physical exam.     Medications and allergies reviewed with patient and updated if appropriate.  Patient Active Problem List   Diagnosis Date Noted  . ADHD (attention deficit hyperactivity disorder), combined type 08/14/2014  . Avitaminosis D 08/16/2012    Current Outpatient Prescriptions on File Prior to Visit  Medication Sig Dispense Refill  . b complex vitamins tablet Take 1 tablet by mouth daily.    . Calcium 200 MG TABS Take by mouth daily.    . Ergocalciferol (VITAMIN D2) 2000 units TABS Take by mouth daily.    . Flaxseed, Linseed, (FLAX SEED OIL PO) Take by mouth daily.    . IRON PO Take by mouth daily.    Marland Kitchen levonorgestrel (MIRENA, 52 MG,) 20 MCG/24HR IUD 1 each by Intrauterine route once.    . Multiple Vitamin (MULTIVITAMIN) tablet Take 1 tablet by mouth daily.    . Probiotic Product (PROBIOTIC ACIDOPHILUS BEADS PO) Take by mouth daily.     No current facility-administered medications on file prior to visit.     Past Medical History:  Diagnosis Date  . Depression   . Hypertension   . STD (sexually transmitted disease)    chlamydia age 71  . Urinary incontinence     Past Surgical History:  Procedure Laterality Date  . APPENDECTOMY    . BREAST LUMPECTOMY     age 73, age 20 (neg)  . DILATION AND CURETTAGE OF UTERUS     age 31  . INTRAUTERINE DEVICE INSERTION     2nd mirena inserted 3/13  . TONSILLECTOMY AND ADENOIDECTOMY    . TYMPANOSTOMY TUBE PLACEMENT      Social History   Social History  . Marital status: Married    Spouse name: N/A  . Number of children: N/A  . Years of education: N/A   Social History Main Topics  . Smoking status: Never Smoker  . Smokeless tobacco: Never Used  . Alcohol use 2.4 - 4.2 oz/week    4 - 7 Standard drinks or equivalent per week  . Drug use: No  . Sexual activity: Yes    Partners: Female, Female     Birth control/ protection: IUD     Comment: married to [place] IUD inserted 06-16-16   Other Topics Concern  . 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 Hypertension Mother   . Osteoporosis Mother   . Cancer Sister        ? unsure if she does  . Hypertension Sister   . Heart attack Maternal Grandfather   . Osteoporosis Maternal Aunt   . Osteoporosis Maternal Grandmother   . Osteoporosis Paternal Grandmother   . Osteopenia Father   . Heart disease Paternal Grandfather     Review of Systems  Constitutional: Negative for appetite change, chills, fatigue, fever and unexpected weight change.  Eyes: Negative for visual disturbance.  Respiratory: Negative for cough, shortness of breath and wheezing.   Cardiovascular: Negative for chest pain, palpitations and leg swelling.  Gastrointestinal: Negative for abdominal pain, blood in stool, constipation, diarrhea and nausea.       No gerd  Genitourinary:  Negative for dysuria and hematuria.  Musculoskeletal: Negative for arthralgias and back pain.  Skin: Negative for color change and rash.  Neurological: Negative for dizziness, light-headedness and headaches.  Psychiatric/Behavioral: Negative for dysphoric mood. The patient is not nervous/anxious.        Objective:   Vitals:   09/04/16 0801  BP: 112/84  Pulse: 71  Resp: 16  Temp: 98.7 F (37.1 C)   Filed Weights   09/04/16 0801  Weight: 124 lb (56.2 kg)   Body mass index is 22.68 kg/m.  Wt Readings from Last 3 Encounters:  09/04/16 124 lb (56.2 kg)  07/24/16 121 lb 6.4 oz (55.1 kg)  06/16/16 125 lb (56.7 kg)     Physical Exam Constitutional: She appears well-developed and well-nourished. No distress.  HENT:  Head: Normocephalic and atraumatic.  Right Ear: External ear normal. Normal ear canal and  TM Left Ear: External ear normal.  Normal ear canal and TM Mouth/Throat: Oropharynx is clear and moist.  Eyes: Conjunctivae and EOM are normal.  Neck: Neck supple. No tracheal deviation present. No thyromegaly present.  No carotid bruit  Cardiovascular: Normal rate, regular rhythm and normal heart sounds.   No murmur heard.  No edema. Pulmonary/Chest: Effort normal and breath sounds normal. No respiratory distress. She has no wheezes. She has no rales.  Breast: deferred to Gyn Abdominal: Soft. She exhibits no distension. There is no tenderness.  Lymphadenopathy: She has no cervical adenopathy.  Skin: Skin is warm and dry. She is not diaphoretic.  Psychiatric: She has a normal mood and affect. Her behavior is normal.         Assessment & Plan:   Physical exam: Screening blood work  ordered Immunizations    Up to date  Mammogram   Up to date  Gyn     Up to date    Exercise  - regular Weight    Normal BMI Skin     No concerns Substance abuse  none  See Problem List for Assessment and Plan of chronic medical problems.   FU annually

## 2016-09-03 NOTE — Patient Instructions (Addendum)
Test(s) ordered today. Your results will be released to MyChart (or called to you) after review, usually within 72hours after test completion. If any changes need to be made, you will be notified at that same time.  All other Health Maintenance issues reviewed.   All recommended immunizations and age-appropriate screenings are up-to-date or discussed.  No immunizations administered today.   Medications reviewed and updated.  No changes recommended at this time.   Please followup in one year   Health Maintenance, Female Adopting a healthy lifestyle and getting preventive care can go a long way to promote health and wellness. Talk with your health care provider about what schedule of regular examinations is right for you. This is a good chance for you to check in with your provider about disease prevention and staying healthy. In between checkups, there are plenty of things you can do on your own. Experts have done a lot of research about which lifestyle changes and preventive measures are most likely to keep you healthy. Ask your health care provider for more information. Weight and diet Eat a healthy diet  Be sure to include plenty of vegetables, fruits, low-fat dairy products, and lean protein.  Do not eat a lot of foods high in solid fats, added sugars, or salt.  Get regular exercise. This is one of the most important things you can do for your health.  Most adults should exercise for at least 150 minutes each week. The exercise should increase your heart rate and make you sweat (moderate-intensity exercise).  Most adults should also do strengthening exercises at least twice a week. This is in addition to the moderate-intensity exercise. Maintain a healthy weight  Body mass index (BMI) is a measurement that can be used to identify possible weight problems. It estimates body fat based on height and weight. Your health care provider can help determine your BMI and help you achieve or  maintain a healthy weight.  For females 20 years of age and older:  A BMI below 18.5 is considered underweight.  A BMI of 18.5 to 24.9 is normal.  A BMI of 25 to 29.9 is considered overweight.  A BMI of 30 and above is considered obese. Watch levels of cholesterol and blood lipids  You should start having your blood tested for lipids and cholesterol at 47 years of age, then have this test every 5 years.  You may need to have your cholesterol levels checked more often if:  Your lipid or cholesterol levels are high.  You are older than 47 years of age.  You are at high risk for heart disease. Cancer screening Lung Cancer  Lung cancer screening is recommended for adults 55-80 years old who are at high risk for lung cancer because of a history of smoking.  A yearly low-dose CT scan of the lungs is recommended for people who:  Currently smoke.  Have quit within the past 15 years.  Have at least a 30-pack-year history of smoking. A pack year is smoking an average of one pack of cigarettes a day for 1 year.  Yearly screening should continue until it has been 15 years since you quit.  Yearly screening should stop if you develop a health problem that would prevent you from having lung cancer treatment. Breast Cancer  Practice breast self-awareness. This means understanding how your breasts normally appear and feel.  It also means doing regular breast self-exams. Let your health care provider know about any changes, no matter how small.    If you are in your 20s or 30s, you should have a clinical breast exam (CBE) by a health care provider every 1-3 years as part of a regular health exam.  If you are 18 or older, have a CBE every year. Also consider having a breast X-ray (mammogram) every year.  If you have a family history of breast cancer, talk to your health care provider about genetic screening.  If you are at high risk for breast cancer, talk to your health care provider  about having an MRI and a mammogram every year.  Breast cancer gene (BRCA) assessment is recommended for women who have family members with BRCA-related cancers. BRCA-related cancers include:  Breast.  Ovarian.  Tubal.  Peritoneal cancers.  Results of the assessment will determine the need for genetic counseling and BRCA1 and BRCA2 testing. Cervical Cancer  Your health care provider may recommend that you be screened regularly for cancer of the pelvic organs (ovaries, uterus, and vagina). This screening involves a pelvic examination, including checking for microscopic changes to the surface of your cervix (Pap test). You may be encouraged to have this screening done every 3 years, beginning at age 73.  For women ages 33-65, health care providers may recommend pelvic exams and Pap testing every 3 years, or they may recommend the Pap and pelvic exam, combined with testing for human papilloma virus (HPV), every 5 years. Some types of HPV increase your risk of cervical cancer. Testing for HPV may also be done on women of any age with unclear Pap test results.  Other health care providers may not recommend any screening for nonpregnant women who are considered low risk for pelvic cancer and who do not have symptoms. Ask your health care provider if a screening pelvic exam is right for you.  If you have had past treatment for cervical cancer or a condition that could lead to cancer, you need Pap tests and screening for cancer for at least 20 years after your treatment. If Pap tests have been discontinued, your risk factors (such as having a new sexual partner) need to be reassessed to determine if screening should resume. Some women have medical problems that increase the chance of getting cervical cancer. In these cases, your health care provider may recommend more frequent screening and Pap tests. Colorectal Cancer  This type of cancer can be detected and often prevented.  Routine colorectal  cancer screening usually begins at 47 years of age and continues through 47 years of age.  Your health care provider may recommend screening at an earlier age if you have risk factors for colon cancer.  Your health care provider may also recommend using home test kits to check for hidden blood in the stool.  A small camera at the end of a tube can be used to examine your colon directly (sigmoidoscopy or colonoscopy). This is done to check for the earliest forms of colorectal cancer.  Routine screening usually begins at age 62.  Direct examination of the colon should be repeated every 5-10 years through 47 years of age. However, you may need to be screened more often if early forms of precancerous polyps or small growths are found. Skin Cancer  Check your skin from head to toe regularly.  Tell your health care provider about any new moles or changes in moles, especially if there is a change in a mole's shape or color.  Also tell your health care provider if you have a mole that is larger than  the size of a pencil eraser.  Always use sunscreen. Apply sunscreen liberally and repeatedly throughout the day.  Protect yourself by wearing long sleeves, pants, a wide-brimmed hat, and sunglasses whenever you are outside. Heart disease, diabetes, and high blood pressure  High blood pressure causes heart disease and increases the risk of stroke. High blood pressure is more likely to develop in:  People who have blood pressure in the high end of the normal range (130-139/85-89 mm Hg).  People who are overweight or obese.  People who are African American.  If you are 41-71 years of age, have your blood pressure checked every 3-5 years. If you are 94 years of age or older, have your blood pressure checked every year. You should have your blood pressure measured twice-once when you are at a hospital or clinic, and once when you are not at a hospital or clinic. Record the average of the two  measurements. To check your blood pressure when you are not at a hospital or clinic, you can use:  An automated blood pressure machine at a pharmacy.  A home blood pressure monitor.  If you are between 81 years and 22 years old, ask your health care provider if you should take aspirin to prevent strokes.  Have regular diabetes screenings. This involves taking a blood sample to check your fasting blood sugar level.  If you are at a normal weight and have a low risk for diabetes, have this test once every three years after 47 years of age.  If you are overweight and have a high risk for diabetes, consider being tested at a younger age or more often. Preventing infection Hepatitis B  If you have a higher risk for hepatitis B, you should be screened for this virus. You are considered at high risk for hepatitis B if:  You were born in a country where hepatitis B is common. Ask your health care provider which countries are considered high risk.  Your parents were born in a high-risk country, and you have not been immunized against hepatitis B (hepatitis B vaccine).  You have HIV or AIDS.  You use needles to inject street drugs.  You live with someone who has hepatitis B.  You have had sex with someone who has hepatitis B.  You get hemodialysis treatment.  You take certain medicines for conditions, including cancer, organ transplantation, and autoimmune conditions. Hepatitis C  Blood testing is recommended for:  Everyone born from [birth year] through 1966.  Anyone with known risk factors for hepatitis C. Sexually transmitted infections (STIs)  You should be screened for sexually transmitted infections (STIs) including gonorrhea and chlamydia if:  You are sexually active and are younger than 47 years of age.  You are older than 47 years of age and your health care provider tells you that you are at risk for this type of infection.  Your sexual activity has changed since you were last  screened and you are at an increased risk for chlamydia or gonorrhea. Ask your health care provider if you are at risk.  If you do not have HIV, but are at risk, it may be recommended that you take a prescription medicine daily to prevent HIV infection. This is called pre-exposure prophylaxis (PrEP). You are considered at risk if:  You are sexually active and do not regularly use condoms or know the HIV status of your partner(s).  You take drugs by injection.  You are sexually active with a partner who has  HIV. Talk with your health care provider about whether you are at high risk of being infected with HIV. If you choose to begin PrEP, you should first be tested for HIV. You should then be tested every 3 months for as long as you are taking PrEP. Pregnancy  If you are premenopausal and you may become pregnant, ask your health care provider about preconception counseling.  If you may become pregnant, take 400 to 800 micrograms (mcg) of folic acid every day.  If you want to prevent pregnancy, talk to your health care provider about birth control (contraception). Osteoporosis and menopause  Osteoporosis is a disease in which the bones lose minerals and strength with aging. This can result in serious bone fractures. Your risk for osteoporosis can be identified using a bone density scan.  If you are 65 years of age or older, or if you are at risk for osteoporosis and fractures, ask your health care provider if you should be screened.  Ask your health care provider whether you should take a calcium or vitamin D supplement to lower your risk for osteoporosis.  Menopause may have certain physical symptoms and risks.  Hormone replacement therapy may reduce some of these symptoms and risks. Talk to your health care provider about whether hormone replacement therapy is right for you. Follow these instructions at home:  Schedule regular health, dental, and eye exams.  Stay current with your  immunizations.  Do not use any tobacco products including cigarettes, chewing tobacco, or electronic cigarettes.  If you are pregnant, do not drink alcohol.  If you are breastfeeding, limit how much and how often you drink alcohol.  Limit alcohol intake to no more than 1 drink per day for nonpregnant women. One drink equals 12 ounces of beer, 5 ounces of wine, or 1 ounces of hard liquor.  Do not use street drugs.  Do not share needles.  Ask your health care provider for help if you need support or information about quitting drugs.  Tell your health care provider if you often feel depressed.  Tell your health care provider if you have ever been abused or do not feel safe at home. This information is not intended to replace advice given to you by your health care provider. Make sure you discuss any questions you have with your health care provider. Document Released: 10/17/2010 Document Revised: 09/09/2015 Document Reviewed: 01/05/2015 Elsevier Interactive Patient Education  2017 [employer] Inc[employer]  

## 2016-09-04 ENCOUNTER — Encounter: Payer: Self-pay | Admitting: Internal Medicine

## 2016-09-04 ENCOUNTER — Other Ambulatory Visit (INDEPENDENT_AMBULATORY_CARE_PROVIDER_SITE_OTHER): Payer: BLUE CROSS/BLUE SHIELD

## 2016-09-04 ENCOUNTER — Ambulatory Visit (INDEPENDENT_AMBULATORY_CARE_PROVIDER_SITE_OTHER): Payer: BLUE CROSS/BLUE SHIELD | Admitting: Internal Medicine

## 2016-09-04 VITALS — BP 112/84 | HR 71 | Temp 98.7°F | Resp 16 | Ht 62.0 in | Wt 124.0 lb

## 2016-09-04 DIAGNOSIS — Z0000 Encounter for general adult medical examination without abnormal findings: Secondary | ICD-10-CM | POA: Diagnosis not present

## 2016-09-04 DIAGNOSIS — E559 Vitamin D deficiency, unspecified: Secondary | ICD-10-CM

## 2016-09-04 LAB — CBC WITH DIFFERENTIAL/PLATELET
Basophils Absolute: 0.1 10*3/uL (ref 0.0–0.1)
Basophils Relative: 2.1 % (ref 0.0–3.0)
EOS PCT: 0.5 % (ref 0.0–5.0)
Eosinophils Absolute: 0 10*3/uL (ref 0.0–0.7)
HEMATOCRIT: 39.6 % (ref 36.0–46.0)
Hemoglobin: 13.5 g/dL (ref 12.0–15.0)
LYMPHS ABS: 1.4 10*3/uL (ref 0.7–4.0)
Lymphocytes Relative: 25.7 % (ref 12.0–46.0)
MCHC: 34.1 g/dL (ref 30.0–36.0)
MCV: 92.2 fl (ref 78.0–100.0)
Monocytes Absolute: 0.5 10*3/uL (ref 0.1–1.0)
Monocytes Relative: 8.4 % (ref 3.0–12.0)
NEUTROS ABS: 3.4 10*3/uL (ref 1.4–7.7)
NEUTROS PCT: 63.3 % (ref 43.0–77.0)
Platelets: 301 10*3/uL (ref 150.0–400.0)
RBC: 4.3 Mil/uL (ref 3.87–5.11)
RDW: 13.4 % (ref 11.5–15.5)
WBC: 5.4 10*3/uL (ref 4.0–10.5)

## 2016-09-04 LAB — COMPREHENSIVE METABOLIC PANEL
ALK PHOS: 46 U/L (ref 39–117)
ALT: 12 U/L (ref 0–35)
AST: 16 U/L (ref 0–37)
Albumin: 4.3 g/dL (ref 3.5–5.2)
BUN: 11 mg/dL (ref 6–23)
CALCIUM: 9 mg/dL (ref 8.4–10.5)
CO2: 28 mEq/L (ref 19–32)
Chloride: 104 mEq/L (ref 96–112)
Creatinine, Ser: 0.74 mg/dL (ref 0.40–1.20)
GFR: 89.6 mL/min (ref >60.00)
GLUCOSE: 89 mg/dL (ref 70–99)
POTASSIUM: 3.7 meq/L (ref 3.5–5.1)
Sodium: 137 mEq/L (ref 135–145)
TOTAL PROTEIN: 6.5 g/dL (ref 6.0–8.3)
Total Bilirubin: 0.7 mg/dL (ref 0.2–1.2)

## 2016-09-04 LAB — LIPID PANEL
CHOLESTEROL: 155 mg/dL (ref 0–200)
HDL: 74.7 mg/dL (ref >39.00)
LDL Cholesterol: 67 mg/dL (ref 0–99)
NONHDL: 80.54
Total CHOL/HDL Ratio: 2
Triglycerides: 70 mg/dL (ref 0.0–149.0)
VLDL: 14 mg/dL (ref 0.0–40.0)

## 2016-09-04 LAB — TSH: TSH: 1.85 u[IU]/mL (ref 0.35–4.50)

## 2016-09-04 NOTE — Assessment & Plan Note (Signed)
Taking vitamin D daily 

## 2016-09-08 DIAGNOSIS — F909 Attention-deficit hyperactivity disorder, unspecified type: Secondary | ICD-10-CM | POA: Diagnosis not present

## 2016-09-15 DIAGNOSIS — H5212 Myopia, left eye: Secondary | ICD-10-CM | POA: Diagnosis not present

## 2016-09-22 DIAGNOSIS — F909 Attention-deficit hyperactivity disorder, unspecified type: Secondary | ICD-10-CM | POA: Diagnosis not present

## 2016-10-13 ENCOUNTER — Encounter: Payer: Self-pay | Admitting: Certified Nurse Midwife

## 2016-10-13 ENCOUNTER — Ambulatory Visit (INDEPENDENT_AMBULATORY_CARE_PROVIDER_SITE_OTHER): Payer: BLUE CROSS/BLUE SHIELD | Admitting: Certified Nurse Midwife

## 2016-10-13 VITALS — BP 98/64 | HR 64 | Resp 16 | Ht 61.5 in | Wt 122.0 lb

## 2016-10-13 DIAGNOSIS — Z01419 Encounter for gynecological examination (general) (routine) without abnormal findings: Secondary | ICD-10-CM

## 2016-10-13 DIAGNOSIS — Z30431 Encounter for routine checking of intrauterine contraceptive device: Secondary | ICD-10-CM | POA: Diagnosis not present

## 2016-10-13 NOTE — Patient Instructions (Signed)

## 2016-10-13 NOTE — Progress Notes (Signed)
46 y.o. G0P0000 Married  Caucasian Fe here for annual exam. Periods normal, no issues. IUD recently replaced with Mirena IUD, no concerns. Sees PCP for aex and labs. Due for mammogram next month and has received reminder. No other health concerns today. Plans to visit her [place] this summer!  No LMP recorded. Patient is not currently having periods (Reason: IUD).          Sexually active: Yes.    The current method of family planning is IUD.    Exercising: Yes.    yoga, weights, strength training Smoker:  no  Health Maintenance: Pap:  04-26-13 neg HPV HR neg, 10-08-15 neg History of Abnormal Pap: yes yrs ago MMG:  11-12-15 category c density birads 1:neg Self Breast exams: yes Colonoscopy:  None  BMD:   none TDaP:  2016 Shingles: no Pneumonia: no Hep C and HIV: HIV neg 2017 Labs: none   reports that she has never smoked. She has never used smokeless tobacco. She reports that she drinks about 2.4 - 3.0 oz of alcohol per week . She reports that she does not use drugs.  Past Medical History:  Diagnosis Date  . Depression   . Hypertension   . STD (sexually transmitted disease)    chlamydia age 71  . Urinary incontinence     Past Surgical History:  Procedure Laterality Date  . APPENDECTOMY    . BREAST LUMPECTOMY     age 45, age 30 (neg)  . DILATION AND CURETTAGE OF UTERUS     age 55  . INTRAUTERINE DEVICE INSERTION     mirena removed & re-inserted 06-16-16  . TONSILLECTOMY AND ADENOIDECTOMY    . TYMPANOSTOMY TUBE PLACEMENT      Current Outpatient Prescriptions  Medication Sig Dispense Refill  . Acetylcysteine (NAC PO) Take by mouth.    Marland Kitchen b complex vitamins tablet Take 1 tablet by mouth daily.    . Calcium 200 MG TABS Take by mouth daily.    . Ergocalciferol (VITAMIN D2) 2000 units TABS Take by mouth daily.    . Flaxseed, Linseed, (FLAX SEED OIL PO) Take by mouth daily.    . IRON PO Take by mouth daily.    Marland Kitchen levonorgestrel (MIRENA, 52 MG,) 20 MCG/24HR IUD 1 each by  Intrauterine route once.    . Multiple Vitamin (MULTIVITAMIN) tablet Take 1 tablet by mouth daily.    . Probiotic Product (PROBIOTIC ACIDOPHILUS BEADS PO) Take by mouth daily.    Marland Kitchen QUERCETIN PO Take by mouth.     No current facility-administered medications for this visit.     Family History  Problem Relation Age of Onset  . Hypertension Mother   . Osteoporosis Mother   . Alzheimer's disease Mother   . Cancer Sister        ? unsure if she does  . Hypertension Sister   . Osteopenia Father   . Heart attack Maternal Grandfather   . Osteoporosis Maternal Aunt   . Osteoporosis Maternal Grandmother   . Osteoporosis Paternal Grandmother   . Heart disease Paternal Grandfather     ROS:  Pertinent items are noted in HPI.  Otherwise, a comprehensive ROS was negative.  Exam:   BP 98/64   Pulse 64   Resp 16   Ht 5' 1.5" (1.562 m)   Wt 122 lb (55.3 kg)   BMI 22.68 kg/m  Height: 5' 1.5" (156.2 cm) Ht Readings from Last 3 Encounters:  10/13/16 5' 1.5" (1.562 m)  09/04/16  5\' 2"  (1.575 m)  07/24/16 5' 1.75" (1.568 m)    General appearance: alert, cooperative and appears stated age Head: Normocephalic, without obvious abnormality, atraumatic Neck: no adenopathy, supple, symmetrical, trachea midline and thyroid normal to inspection and palpation Lungs: clear to auscultation bilaterally Breasts: normal appearance, no masses or tenderness, No nipple retraction or dimpling, No nipple discharge or bleeding, No axillary or supraclavicular adenopathy Heart: regular rate and rhythm Abdomen: soft, non-tender; no masses,  no organomegaly Extremities: extremities normal, atraumatic, no cyanosis or edema Skin: Skin color, texture, turgor normal. No rashes or lesions Lymph nodes: Cervical, supraclavicular, and axillary nodes normal. No abnormal inguinal nodes palpated Neurologic: Grossly normal   Pelvic: External genitalia:  no lesions              Urethra:  normal appearing urethra with no  masses, tenderness or lesions              Bartholin's and Skene's: normal                 Vagina: normal appearing vagina with normal color and discharge, no lesions              Cervix: no cervical motion tenderness, no lesions and nulliparous appearance IUD string noted in cervix              Pap taken: No. Bimanual Exam:  Uterus:  normal size, contour, position, consistency, mobility, non-tender              Adnexa: normal adnexa and no mass, fullness, tenderness               Rectovaginal: Confirms               Anus:  normal sphincter tone, no lesions  Chaperone present: yes  A:  Well Woman with normal exam  Contraception Mirena IUD due for removal 06/2021  Mammogram due soon will schedule  P:   Reviewed health and wellness pertinent to exam  Reviewed warning signs of IUD and need to advise if problems  Pap smear: no   counseled on breast self exam, mammography screening, adequate intake of calcium and vitamin D, diet and exercise  return annually or prn  An After Visit Summary was printed and given to the patient.

## 2016-10-17 ENCOUNTER — Other Ambulatory Visit: Payer: Self-pay | Admitting: Certified Nurse Midwife

## 2016-10-17 DIAGNOSIS — Z1231 Encounter for screening mammogram for malignant neoplasm of breast: Secondary | ICD-10-CM

## 2016-11-17 ENCOUNTER — Ambulatory Visit
Admission: RE | Admit: 2016-11-17 | Discharge: 2016-11-17 | Disposition: A | Discharge status: Home / Self Care | Payer: BLUE CROSS/BLUE SHIELD | Source: Ambulatory Visit | Attending: Certified Nurse Midwife | Admitting: Certified Nurse Midwife

## 2016-11-17 DIAGNOSIS — Z1231 Encounter for screening mammogram for malignant neoplasm of breast: Secondary | ICD-10-CM | POA: Diagnosis not present

## 2016-11-21 ENCOUNTER — Other Ambulatory Visit: Payer: Self-pay | Admitting: Certified Nurse Midwife

## 2016-11-21 DIAGNOSIS — R928 Other abnormal and inconclusive findings on diagnostic imaging of breast: Secondary | ICD-10-CM

## 2016-12-08 ENCOUNTER — Ambulatory Visit
Admission: RE | Admit: 2016-12-08 | Discharge: 2016-12-08 | Disposition: A | Discharge status: Home / Self Care | Payer: BLUE CROSS/BLUE SHIELD | Source: Ambulatory Visit | Attending: Certified Nurse Midwife | Admitting: Certified Nurse Midwife

## 2016-12-08 ENCOUNTER — Ambulatory Visit: Payer: BLUE CROSS/BLUE SHIELD

## 2016-12-08 DIAGNOSIS — R928 Other abnormal and inconclusive findings on diagnostic imaging of breast: Secondary | ICD-10-CM

## 2016-12-08 DIAGNOSIS — R922 Inconclusive mammogram: Secondary | ICD-10-CM | POA: Diagnosis not present

## 2017-09-05 NOTE — Progress Notes (Signed)
Subjective:    Patient ID: Sara Potter, female    DOB: 1971-01-31, 47 y.o.   MRN: [id number]  HPI She is here for a physical exam.     Medications and allergies reviewed with patient and updated if appropriate.  Patient Active Problem List   Diagnosis Date Noted  . ADHD (attention deficit hyperactivity disorder), combined type 08/14/2014  . Avitaminosis D 08/16/2012    Current Outpatient Medications on File Prior to Visit  Medication Sig Dispense Refill  . Acetylcysteine (NAC PO) Take by mouth.    Marland Kitchen b complex vitamins tablet Take 1 tablet by mouth daily.    . Calcium 200 MG TABS Take by mouth daily.    . Flaxseed, Linseed, (FLAX SEED OIL PO) Take by mouth daily.    . IRON PO Take by mouth daily.    Marland Kitchen levonorgestrel (MIRENA, 52 MG,) 20 MCG/24HR IUD 1 each by Intrauterine route once.    . Multiple Vitamin (MULTIVITAMIN) tablet Take 1 tablet by mouth daily.    . Probiotic Product (PROBIOTIC ACIDOPHILUS BEADS PO) Take by mouth daily.    Marland Kitchen QUERCETIN PO Take by mouth.     No current facility-administered medications on file prior to visit.     Past Medical History:  Diagnosis Date  . Abnormal Pap smear of cervix    just a repeat a long time ago  . Depression   . Hypertension   . STD (sexually transmitted disease)    chlamydia age 30  . Urinary incontinence     Past Surgical History:  Procedure Laterality Date  . APPENDECTOMY    . BREAST EXCISIONAL BIOPSY    . DILATION AND CURETTAGE OF UTERUS     age 43  . INTRAUTERINE DEVICE INSERTION     mirena removed & re-inserted 06-16-16  . TONSILLECTOMY AND ADENOIDECTOMY    . TYMPANOSTOMY TUBE PLACEMENT      Social History   Socioeconomic History  . Marital status: Married    Spouse name: Not on file  . Number of children: Not on file  . Years of education: Not on file  . Highest education level: Not on file  Occupational History  . Not on file  Social Needs  . Financial resource strain: Not on file  . Food  insecurity:    Worry: Not on file    Inability: Not on file  . Transportation needs:    Medical: Not on file    Non-medical: Not on file  Tobacco Use  . Smoking status: Never Smoker  . Smokeless tobacco: Never Used  Substance and Sexual Activity  . Alcohol use: Yes    Alcohol/week: 2.4 - 3.0 oz    Types: 4 - 5 Standard drinks or equivalent per week  . Drug use: No  . Sexual activity: Yes    Partners: Female, Female    Birth control/protection: IUD    Comment: married to [place] IUD inserted 06-16-16  Lifestyle  . Physical activity:    Days per week: Not on file    Minutes per session: Not on file  . Stress: Not on file  Relationships  . Social connections:    Talks on phone: Not on file    Gets together: Not on file    Attends religious service: Not on file    Active member of club or organization: Not on file    Attends meetings of clubs or organizations: Not on file    Relationship status: Not on file  Other Topics Concern  . Not on file  Social History Narrative   Exercises daily - yoga, bike, jump rope, stairs, treadmill   Mental health therapist   Married   No children    Family History  Problem Relation Age of Onset  . Hypertension Mother   . Osteoporosis Mother   . Alzheimer's disease Mother   . Cancer Sister        ? unsure if she does  . Hypertension Sister   . Osteopenia Father   . Heart attack Maternal Grandfather   . Osteoporosis Maternal Aunt   . Osteoporosis Maternal Grandmother   . Osteoporosis Paternal Grandmother   . Heart disease Paternal Grandfather     Review of Systems  Constitutional: Negative for chills and fever.  Eyes: Negative for visual disturbance.  Respiratory: Negative for cough, shortness of breath and wheezing.   Cardiovascular: Negative for chest pain, palpitations and leg swelling.  Gastrointestinal: Negative for abdominal pain, blood in stool, constipation, diarrhea and nausea.       No gerd  Genitourinary: Negative  for dysuria and hematuria.  Musculoskeletal: Negative for arthralgias.  Skin: Negative for color change and rash.  Neurological: Negative for dizziness, light-headedness and headaches.  Psychiatric/Behavioral: Negative for dysphoric mood and sleep disturbance. The patient is not nervous/anxious.        Objective:   Vitals:   09/06/17 0801  BP: 114/84  Pulse: 67  Resp: 16  Temp: 98.3 F (36.8 C)  SpO2: 99%   Filed Weights   09/06/17 0801  Weight: 125 lb (56.7 kg)   Body mass index is 23.24 kg/m.  Wt Readings from Last 3 Encounters:  09/06/17 125 lb (56.7 kg)  10/13/16 122 lb (55.3 kg)  09/04/16 124 lb (56.2 kg)     Physical Exam Constitutional: She appears well-developed and well-nourished. No distress.  HENT:  Head: Normocephalic and atraumatic.  Right Ear: External ear normal. Normal ear canal and TM Left Ear: External ear normal.  Normal ear canal and TM Mouth/Throat: Oropharynx is clear and moist.  Eyes: Conjunctivae and EOM are normal.  Neck: Neck supple. No tracheal deviation present. No thyromegaly present.  No carotid bruit  Cardiovascular: Normal rate, regular rhythm and normal heart sounds.   No murmur heard.  No edema. Pulmonary/Chest: Effort normal and breath sounds normal. No respiratory distress. She has no wheezes. She has no rales.  Breast: deferred to Gyn Abdominal: Soft. She exhibits no distension. There is no tenderness.  Lymphadenopathy: She has no cervical adenopathy.  Skin: Skin is warm and dry. She is not diaphoretic.  Psychiatric: She has a normal mood and affect. Her behavior is normal.        Assessment & Plan:   Physical exam: Screening blood work  ordered Immunizations   Up to date  Mammogram   Up to date  Gyn   Up to date  Eye exams  Up to date  Exercise   daily Weight  Normal BMI Skin   No concerns Substance abuse   none  See Problem List for Assessment and Plan of chronic medical problems.   FU in one year

## 2017-09-05 NOTE — Patient Instructions (Addendum)
Test(s) ordered today. Your results will be released to [facility] (or called to you) after review, usually within 72hours after test completion. If any changes need to be made, you will be notified at that same time.  All other Health Maintenance issues reviewed.   All recommended immunizations and age-appropriate screenings are up-to-date or discussed.  No immunizations administered today.   Medications reviewed and updated.  No changes recommended at this time.   Please followup in one year   Health Maintenance, Female Adopting a healthy lifestyle and getting preventive care can go a long way to promote health and wellness. Talk with your health care provider about what schedule of regular examinations is right for you. This is a good chance for you to check in with your provider about disease prevention and staying healthy. In between checkups, there are plenty of things you can do on your own. Experts have done a lot of research about which lifestyle changes and preventive measures are most likely to keep you healthy. Ask your health care provider for more information. Weight and diet Eat a healthy diet  Be sure to include plenty of vegetables, fruits, low-fat dairy products, and lean protein.  Do not eat a lot of foods high in solid fats, added sugars, or salt.  Get regular exercise. This is one of the most important things you can do for your health. ? Most adults should exercise for at least 150 minutes each week. The exercise should increase your heart rate and make you sweat (moderate-intensity exercise). ? Most adults should also do strengthening exercises at least twice a week. This is in addition to the moderate-intensity exercise.  Maintain a healthy weight  Body mass index (BMI) is a measurement that can be used to identify possible weight problems. It estimates body fat based on height and weight. Your health care provider can help determine your BMI and help you achieve or  maintain a healthy weight.  For females 64 years of age and older: ? A BMI below 18.5 is considered underweight. ? A BMI of 18.5 to 24.9 is normal. ? A BMI of 25 to 29.9 is considered overweight. ? A BMI of 30 and above is considered obese.  Watch levels of cholesterol and blood lipids  You should start having your blood tested for lipids and cholesterol at 48 years of age, then have this test every 5 years.  You may need to have your cholesterol levels checked more often if: ? Your lipid or cholesterol levels are high. ? You are older than 48 years of age. ? You are at high risk for heart disease.  Cancer screening Lung Cancer  Lung cancer screening is recommended for adults 72-7 years old who are at high risk for lung cancer because of a history of smoking.  A yearly low-dose CT scan of the lungs is recommended for people who: ? Currently smoke. ? Have quit within the past 15 years. ? Have at least a 30-pack-year history of smoking. A pack year is smoking an average of one pack of cigarettes a day for 1 year.  Yearly screening should continue until it has been 15 years since you quit.  Yearly screening should stop if you develop a health problem that would prevent you from having lung cancer treatment.  Breast Cancer  Practice breast self-awareness. This means understanding how your breasts normally appear and feel.  It also means doing regular breast self-exams. Let your health care provider know about any changes,  no matter how small.  If you are in your 20s or 30s, you should have a clinical breast exam (CBE) by a health care provider every 1-3 years as part of a regular health exam.  If you are 40 or older, have a CBE every year. Also consider having a breast X-ray (mammogram) every year.  If you have a family history of breast cancer, talk to your health care provider about genetic screening.  If you are at high risk for breast cancer, talk to your health care  provider about having an MRI and a mammogram every year.  Breast cancer gene (BRCA) assessment is recommended for women who have family members with BRCA-related cancers. BRCA-related cancers include: ? Breast. ? Ovarian. ? Tubal. ? Peritoneal cancers.  Results of the assessment will determine the need for genetic counseling and BRCA1 and BRCA2 testing.  Cervical Cancer Your health care provider may recommend that you be screened regularly for cancer of the pelvic organs (ovaries, uterus, and vagina). This screening involves a pelvic examination, including checking for microscopic changes to the surface of your cervix (Pap test). You may be encouraged to have this screening done every 3 years, beginning at age 21.  For women ages 30-65, health care providers may recommend pelvic exams and Pap testing every 3 years, or they may recommend the Pap and pelvic exam, combined with testing for human papilloma virus (HPV), every 5 years. Some types of HPV increase your risk of cervical cancer. Testing for HPV may also be done on women of any age with unclear Pap test results.  Other health care providers may not recommend any screening for nonpregnant women who are considered low risk for pelvic cancer and who do not have symptoms. Ask your health care provider if a screening pelvic exam is right for you.  If you have had past treatment for cervical cancer or a condition that could lead to cancer, you need Pap tests and screening for cancer for at least 20 years after your treatment. If Pap tests have been discontinued, your risk factors (such as having a new sexual partner) need to be reassessed to determine if screening should resume. Some women have medical problems that increase the chance of getting cervical cancer. In these cases, your health care provider may recommend more frequent screening and Pap tests.  Colorectal Cancer  This type of cancer can be detected and often prevented.  Routine  colorectal cancer screening usually begins at 48 years of age and continues through 48 years of age.  Your health care provider may recommend screening at an earlier age if you have risk factors for colon cancer.  Your health care provider may also recommend using home test kits to check for hidden blood in the stool.  A small camera at the end of a tube can be used to examine your colon directly (sigmoidoscopy or colonoscopy). This is done to check for the earliest forms of colorectal cancer.  Routine screening usually begins at age 50.  Direct examination of the colon should be repeated every 5-10 years through 48 years of age. However, you may need to be screened more often if early forms of precancerous polyps or small growths are found.  Skin Cancer  Check your skin from head to toe regularly.  Tell your health care provider about any new moles or changes in moles, especially if there is a change in a mole's shape or color.  Also tell your health care provider if you   have a mole that is larger than the size of a pencil eraser.  Always use sunscreen. Apply sunscreen liberally and repeatedly throughout the day.  Protect yourself by wearing long sleeves, pants, a wide-brimmed hat, and sunglasses whenever you are outside.  Heart disease, diabetes, and high blood pressure  High blood pressure causes heart disease and increases the risk of stroke. High blood pressure is more likely to develop in: ? People who have blood pressure in the high end of the normal range (130-139/85-89 mm Hg). ? People who are overweight or obese. ? People who are African American.  If you are 24-25 years of age, have your blood pressure checked every 3-5 years. If you are 2 years of age or older, have your blood pressure checked every year. You should have your blood pressure measured twice-once when you are at a hospital or clinic, and once when you are not at a hospital or clinic. Record the average of the  two measurements. To check your blood pressure when you are not at a hospital or clinic, you can use: ? An automated blood pressure machine at a pharmacy. ? A home blood pressure monitor.  If you are between 42 years and 59 years old, ask your health care provider if you should take aspirin to prevent strokes.  Have regular diabetes screenings. This involves taking a blood sample to check your fasting blood sugar level. ? If you are at a normal weight and have a low risk for diabetes, have this test once every three years after 48 years of age. ? If you are overweight and have a high risk for diabetes, consider being tested at a younger age or more often. Preventing infection Hepatitis B  If you have a higher risk for hepatitis B, you should be screened for this virus. You are considered at high risk for hepatitis B if: ? You were born in a country where hepatitis B is common. Ask your health care provider which countries are considered high risk. ? Your parents were born in a high-risk country, and you have not been immunized against hepatitis B (hepatitis B vaccine). ? You have HIV or AIDS. ? You use needles to inject street drugs. ? You live with someone who has hepatitis B. ? You have had sex with someone who has hepatitis B. ? You get hemodialysis treatment. ? You take certain medicines for conditions, including cancer, organ transplantation, and autoimmune conditions.  Hepatitis C  Blood testing is recommended for: ? Everyone born from [birth year] through 1966. ? Anyone with known risk factors for hepatitis C.  Sexually transmitted infections (STIs)  You should be screened for sexually transmitted infections (STIs) including gonorrhea and chlamydia if: ? You are sexually active and are younger than 48 years of age. ? You are older than 48 years of age and your health care provider tells you that you are at risk for this type of infection. ? Your sexual activity has changed since you  were last screened and you are at an increased risk for chlamydia or gonorrhea. Ask your health care provider if you are at risk.  If you do not have HIV, but are at risk, it may be recommended that you take a prescription medicine daily to prevent HIV infection. This is called pre-exposure prophylaxis (PrEP). You are considered at risk if: ? You are sexually active and do not regularly use condoms or know the HIV status of your partner(s). ? You take drugs by injection. ?  You are sexually active with a partner who has HIV.  Talk with your health care provider about whether you are at high risk of being infected with HIV. If you choose to begin PrEP, you should first be tested for HIV. You should then be tested every 3 months for as long as you are taking PrEP. Pregnancy  If you are premenopausal and you may become pregnant, ask your health care provider about preconception counseling.  If you may become pregnant, take 400 to 800 micrograms (mcg) of folic acid every day.  If you want to prevent pregnancy, talk to your health care provider about birth control (contraception). Osteoporosis and menopause  Osteoporosis is a disease in which the bones lose minerals and strength with aging. This can result in serious bone fractures. Your risk for osteoporosis can be identified using a bone density scan.  If you are 65 years of age or older, or if you are at risk for osteoporosis and fractures, ask your health care provider if you should be screened.  Ask your health care provider whether you should take a calcium or vitamin D supplement to lower your risk for osteoporosis.  Menopause may have certain physical symptoms and risks.  Hormone replacement therapy may reduce some of these symptoms and risks. Talk to your health care provider about whether hormone replacement therapy is right for you. Follow these instructions at home:  Schedule regular health, dental, and eye exams.  Stay current  with your immunizations.  Do not use any tobacco products including cigarettes, chewing tobacco, or electronic cigarettes.  If you are pregnant, do not drink alcohol.  If you are breastfeeding, limit how much and how often you drink alcohol.  Limit alcohol intake to no more than 1 drink per day for nonpregnant women. One drink equals 12 ounces of beer, 5 ounces of wine, or 1 ounces of hard liquor.  Do not use street drugs.  Do not share needles.  Ask your health care provider for help if you need support or information about quitting drugs.  Tell your health care provider if you often feel depressed.  Tell your health care provider if you have ever been abused or do not feel safe at home. This information is not intended to replace advice given to you by your health care provider. Make sure you discuss any questions you have with your health care provider. Document Released: 10/17/2010 Document Revised: 09/09/2015 Document Reviewed: 01/05/2015 Elsevier Interactive Patient Education  2018 [employer] Inc[employer]  

## 2017-09-06 ENCOUNTER — Encounter: Payer: Self-pay | Admitting: Internal Medicine

## 2017-09-06 ENCOUNTER — Ambulatory Visit (INDEPENDENT_AMBULATORY_CARE_PROVIDER_SITE_OTHER): Payer: BLUE CROSS/BLUE SHIELD | Admitting: Internal Medicine

## 2017-09-06 ENCOUNTER — Other Ambulatory Visit (INDEPENDENT_AMBULATORY_CARE_PROVIDER_SITE_OTHER): Payer: BLUE CROSS/BLUE SHIELD

## 2017-09-06 VITALS — BP 114/84 | HR 67 | Temp 98.3°F | Resp 16 | Ht 61.5 in | Wt 125.0 lb

## 2017-09-06 DIAGNOSIS — Z0000 Encounter for general adult medical examination without abnormal findings: Secondary | ICD-10-CM

## 2017-09-06 LAB — COMPREHENSIVE METABOLIC PANEL
ALBUMIN: 4.1 g/dL (ref 3.5–5.2)
ALT: 10 U/L (ref 0–35)
AST: 15 U/L (ref 0–37)
Alkaline Phosphatase: 38 U/L — ABNORMAL LOW (ref 39–117)
BUN: 9 mg/dL (ref 6–23)
CO2: 27 mEq/L (ref 19–32)
CREATININE: 0.74 mg/dL (ref 0.40–1.20)
Calcium: 9.1 mg/dL (ref 8.4–10.5)
Chloride: 104 mEq/L (ref 96–112)
GFR: 89.21 mL/min (ref >60.00)
GLUCOSE: 81 mg/dL (ref 70–99)
POTASSIUM: 3.9 meq/L (ref 3.5–5.1)
SODIUM: 138 meq/L (ref 135–145)
TOTAL PROTEIN: 6.3 g/dL (ref 6.0–8.3)
Total Bilirubin: 0.6 mg/dL (ref 0.2–1.2)

## 2017-09-06 LAB — LIPID PANEL
CHOLESTEROL: 150 mg/dL (ref 0–200)
HDL: 75.1 mg/dL (ref >39.00)
LDL Cholesterol: 66 mg/dL (ref 0–99)
NONHDL: 74.4
Total CHOL/HDL Ratio: 2
Triglycerides: 41 mg/dL (ref 0.0–149.0)
VLDL: 8.2 mg/dL (ref 0.0–40.0)

## 2017-09-06 LAB — CBC WITH DIFFERENTIAL/PLATELET
BASOS PCT: 1.9 % (ref 0.0–3.0)
Basophils Absolute: 0.1 10*3/uL (ref 0.0–0.1)
EOS PCT: 0.7 % (ref 0.0–5.0)
Eosinophils Absolute: 0 10*3/uL (ref 0.0–0.7)
HCT: 39.1 % (ref 36.0–46.0)
Hemoglobin: 13.3 g/dL (ref 12.0–15.0)
Lymphocytes Relative: 22.9 % (ref 12.0–46.0)
Lymphs Abs: 1.2 10*3/uL (ref 0.7–4.0)
MCHC: 34 g/dL (ref 30.0–36.0)
MCV: 93.1 fl (ref 78.0–100.0)
MONOS PCT: 7.4 % (ref 3.0–12.0)
Monocytes Absolute: 0.4 10*3/uL (ref 0.1–1.0)
NEUTROS ABS: 3.4 10*3/uL (ref 1.4–7.7)
Neutrophils Relative %: 67.1 % (ref 43.0–77.0)
PLATELETS: 277 10*3/uL (ref 150.0–400.0)
RBC: 4.2 Mil/uL (ref 3.87–5.11)
RDW: 13.4 % (ref 11.5–15.5)
WBC: 5.1 10*3/uL (ref 4.0–10.5)

## 2017-09-06 LAB — TSH: TSH: 2.73 u[IU]/mL (ref 0.35–4.50)

## 2017-10-05 DIAGNOSIS — H5213 Myopia, bilateral: Secondary | ICD-10-CM | POA: Diagnosis not present

## 2017-10-26 ENCOUNTER — Ambulatory Visit: Payer: BLUE CROSS/BLUE SHIELD | Admitting: Certified Nurse Midwife

## 2017-11-30 DIAGNOSIS — F909 Attention-deficit hyperactivity disorder, unspecified type: Secondary | ICD-10-CM | POA: Diagnosis not present

## 2017-12-14 ENCOUNTER — Other Ambulatory Visit: Payer: Self-pay

## 2017-12-14 ENCOUNTER — Other Ambulatory Visit (HOSPITAL_COMMUNITY)
Admission: RE | Admit: 2017-12-14 | Discharge: 2017-12-14 | Disposition: A | Discharge status: Home / Self Care | Payer: BLUE CROSS/BLUE SHIELD | Source: Ambulatory Visit | Attending: Certified Nurse Midwife | Admitting: Certified Nurse Midwife

## 2017-12-14 ENCOUNTER — Ambulatory Visit (INDEPENDENT_AMBULATORY_CARE_PROVIDER_SITE_OTHER): Payer: BLUE CROSS/BLUE SHIELD | Admitting: Certified Nurse Midwife

## 2017-12-14 ENCOUNTER — Encounter: Payer: Self-pay | Admitting: Certified Nurse Midwife

## 2017-12-14 ENCOUNTER — Other Ambulatory Visit: Payer: Self-pay | Admitting: Certified Nurse Midwife

## 2017-12-14 VITALS — BP 116/60 | HR 64 | Resp 16 | Ht 61.25 in | Wt 122.0 lb

## 2017-12-14 DIAGNOSIS — Z30431 Encounter for routine checking of intrauterine contraceptive device: Secondary | ICD-10-CM | POA: Diagnosis not present

## 2017-12-14 DIAGNOSIS — Z1231 Encounter for screening mammogram for malignant neoplasm of breast: Secondary | ICD-10-CM

## 2017-12-14 DIAGNOSIS — Z01419 Encounter for gynecological examination (general) (routine) without abnormal findings: Secondary | ICD-10-CM | POA: Diagnosis not present

## 2017-12-14 DIAGNOSIS — Z124 Encounter for screening for malignant neoplasm of cervix: Secondary | ICD-10-CM | POA: Diagnosis not present

## 2017-12-14 NOTE — Progress Notes (Signed)
47 y.o. G0P0000 Married  Caucasian Fe here for annual exam. Periods none with  Mirena IUD.Happy with choice. Sees PCP Dr. Senaida Ores for aex/labs and prn visits. No health issues today.  No LMP recorded. (Menstrual status: IUD).          Sexually active: Yes.    The current method of family planning is IUD, inserted 2018    Exercising: Yes.    bike, row machine, step, weights, yoga, strength Smoker:  no  Review of Systems  Constitutional: Negative.   HENT: Negative.   Eyes: Negative.   Respiratory: Negative.   Cardiovascular: Negative.   Gastrointestinal: Negative.   Genitourinary: Negative.   Musculoskeletal: Negative.   Skin: Negative.   Neurological: Negative.   Endo/Heme/Allergies: Negative.   Psychiatric/Behavioral: Negative.     Health Maintenance: Pap:  04-26-13 neg HPV HR neg, 10-08-15 neg History of Abnormal Pap: yes MMG:  11-20-16 bilateral & 12-08-16 left breast category c density birads 1:neg Self Breast exams: yes Colonoscopy:  none BMD:   none TDaP:  2016 Shingles: no Pneumonia: no Hep C and HIV: HIV neg 2017 Labs: PCP   reports that she has never smoked. She has never used smokeless tobacco. She reports that she drinks about 4.0 - 5.0 standard drinks of alcohol per week. She reports that she does not use drugs.  Past Medical History:  Diagnosis Date  . Abnormal Pap smear of cervix    just a repeat a long time ago  . Depression   . Hypertension   . STD (sexually transmitted disease)    chlamydia age 21  . Urinary incontinence     Past Surgical History:  Procedure Laterality Date  . APPENDECTOMY    . BREAST EXCISIONAL BIOPSY    . DILATION AND CURETTAGE OF UTERUS     age 72  . INTRAUTERINE DEVICE INSERTION     mirena removed & re-inserted 06-16-16  . TONSILLECTOMY AND ADENOIDECTOMY    . TYMPANOSTOMY TUBE PLACEMENT      Current Outpatient Medications  Medication Sig Dispense Refill  . Acetylcysteine (NAC PO) Take by mouth.    Marland Kitchen b complex vitamins tablet  Take 1 tablet by mouth daily.    . Calcium 200 MG TABS Take by mouth daily.    . Flaxseed, Linseed, (FLAX SEED OIL PO) Take by mouth daily.    . IRON PO Take by mouth daily.    Marland Kitchen levonorgestrel (MIRENA, 52 MG,) 20 MCG/24HR IUD 1 each by Intrauterine route once.    . Multiple Vitamin (MULTIVITAMIN) tablet Take 1 tablet by mouth daily.    . Probiotic Product (PROBIOTIC ACIDOPHILUS BEADS PO) Take by mouth daily.    Marland Kitchen QUERCETIN PO Take by mouth.     No current facility-administered medications for this visit.     Family History  Problem Relation Age of Onset  . Hypertension Mother   . Osteoporosis Mother   . Alzheimer's disease Mother   . Cancer Sister        ? unsure if she does  . Hypertension Sister   . Osteopenia Father   . Heart attack Maternal Grandfather   . Osteoporosis Maternal Aunt   . Osteoporosis Maternal Grandmother   . Osteoporosis Paternal Grandmother   . Heart disease Paternal Grandfather     ROS:  Pertinent items are noted in HPI.  Otherwise, a comprehensive ROS was negative.  Exam:   There were no vitals taken for this visit.   Ht Readings from Last 3  Encounters:  09/06/17 5' 1.5" (1.562 m)  10/13/16 5' 1.5" (1.562 m)  09/04/16 5\' 2"  (1.575 m)    General appearance: alert, cooperative and appears stated age Head: Normocephalic, without obvious abnormality, atraumatic Neck: no adenopathy, supple, symmetrical, trachea midline and thyroid normal to inspection and palpation Lungs: clear to auscultation bilaterally Breasts: normal appearance, no masses or tenderness, No nipple retraction or dimpling, No nipple discharge or bleeding, No axillary or supraclavicular adenopathy Heart: regular rate and rhythm Abdomen: soft, non-tender; no masses,  no organomegaly Extremities: extremities normal, atraumatic, no cyanosis or edema Skin: Skin color, texture, turgor normal. No rashes or lesions Lymph nodes: Cervical, supraclavicular, and axillary nodes normal. No  abnormal inguinal nodes palpated Neurologic: Grossly normal   Pelvic: External genitalia:  no lesions, normal female              Urethra:  normal appearing urethra with no masses, tenderness or lesions              Bartholin's and Skene's: normal                 Vagina: normal appearing vagina with normal color and discharge, no lesions              Cervix: no cervical motion tenderness, no lesions and normal appearance and  IUD string noted in cervix              Pap taken: Yes.   Bimanual Exam:  Uterus:  normal size, contour, position, consistency, mobility, non-tender and anteflexed              Adnexa: normal adnexa and no mass, fullness, tenderness               Rectovaginal: Confirms               Anus:  normal sphincter tone, no lesions  Chaperone present: yes  A:  Well Woman with normal exam  Contraception Mirena IUD    P:   Reviewed health and wellness pertinent to exam  Warning signs of IUD given and need to advise.  Pap smear: yes   counseled on breast self exam, mammography screening, feminine hygiene, adequate intake of calcium and vitamin D, diet and exercise  return annually or prn  An After Visit Summary was printed and given to the patient.

## 2017-12-14 NOTE — Patient Instructions (Signed)

## 2017-12-19 LAB — CYTOLOGY - PAP
Diagnosis: NEGATIVE
HPV (WINDOPATH): NOT DETECTED

## 2017-12-21 DIAGNOSIS — F909 Attention-deficit hyperactivity disorder, unspecified type: Secondary | ICD-10-CM | POA: Diagnosis not present

## 2017-12-29 DIAGNOSIS — Z23 Encounter for immunization: Secondary | ICD-10-CM | POA: Diagnosis not present

## 2018-01-11 ENCOUNTER — Ambulatory Visit
Admission: RE | Admit: 2018-01-11 | Discharge: 2018-01-11 | Disposition: A | Discharge status: Home / Self Care | Payer: BLUE CROSS/BLUE SHIELD | Source: Ambulatory Visit | Attending: Certified Nurse Midwife | Admitting: Certified Nurse Midwife

## 2018-01-11 DIAGNOSIS — Z1231 Encounter for screening mammogram for malignant neoplasm of breast: Secondary | ICD-10-CM | POA: Diagnosis not present

## 2018-02-27 ENCOUNTER — Telehealth: Payer: Self-pay

## 2018-02-27 NOTE — Telephone Encounter (Signed)
Flu shot documented

## 2018-02-27 NOTE — Telephone Encounter (Signed)
Copied from [place] [id number]. Topic: General - Other >> Feb 26, 2018  9:26 AM Oneta Rack wrote: Relation to pt: self  Call back number: [phone]   Reason for call:  Patient received a letter reminding her to have her flu shot. Patient states she had her flu shot done at [facility] [address], [place], [place] [address]  [phone] at the end of September, please note

## 2018-05-03 DIAGNOSIS — F909 Attention-deficit hyperactivity disorder, unspecified type: Secondary | ICD-10-CM | POA: Diagnosis not present

## 2018-05-17 DIAGNOSIS — F909 Attention-deficit hyperactivity disorder, unspecified type: Secondary | ICD-10-CM | POA: Diagnosis not present

## 2018-09-11 NOTE — Patient Instructions (Addendum)
Tests ordered today. Your results will be released to MyChart (or called to you) after review, usually within 72hours after test completion. If any changes need to be made, you will be notified at that same time.  All other Health Maintenance issues reviewed.   All recommended immunizations and age-appropriate screenings are up-to-date or discussed.  No immunizations administered today.   Medications reviewed and updated.  Changes include :  none    Please followup in one year   Health Maintenance, Female Adopting a healthy lifestyle and getting preventive care can go a long way to promote health and wellness. Talk with your health care provider about what schedule of regular examinations is right for you. This is a good chance for you to check in with your provider about disease prevention and staying healthy. In between checkups, there are plenty of things you can do on your own. Experts have done a lot of research about which lifestyle changes and preventive measures are most likely to keep you healthy. Ask your health care provider for more information. Weight and diet Eat a healthy diet  Be sure to include plenty of vegetables, fruits, low-fat dairy products, and lean protein.  Do not eat a lot of foods high in solid fats, added sugars, or salt.  Get regular exercise. This is one of the most important things you can do for your health. ? Most adults should exercise for at least 150 minutes each week. The exercise should increase your heart rate and make you sweat (moderate-intensity exercise). ? Most adults should also do strengthening exercises at least twice a week. This is in addition to the moderate-intensity exercise. Maintain a healthy weight  Body mass index (BMI) is a measurement that can be used to identify possible weight problems. It estimates body fat based on height and weight. Your health care provider can help determine your BMI and help you achieve or maintain a  healthy weight.  For females 20 years of age and older: ? A BMI below 18.5 is considered underweight. ? A BMI of 18.5 to 24.9 is normal. ? A BMI of 25 to 29.9 is considered overweight. ? A BMI of 30 and above is considered obese. Watch levels of cholesterol and blood lipids  You should start having your blood tested for lipids and cholesterol at 49 years of age, then have this test every 5 years.  You may need to have your cholesterol levels checked more often if: ? Your lipid or cholesterol levels are high. ? You are older than 50 years of age. ? You are at high risk for heart disease. Cancer screening Lung Cancer  Lung cancer screening is recommended for adults 55-80 years old who are at high risk for lung cancer because of a history of smoking.  A yearly low-dose CT scan of the lungs is recommended for people who: ? Currently smoke. ? Have quit within the past 15 years. ? Have at least a 30-pack-year history of smoking. A pack year is smoking an average of one pack of cigarettes a day for 1 year.  Yearly screening should continue until it has been 15 years since you quit.  Yearly screening should stop if you develop a health problem that would prevent you from having lung cancer treatment. Breast Cancer  Practice breast self-awareness. This means understanding how your breasts normally appear and feel.  It also means doing regular breast self-exams. Let your health care provider know about any changes, no matter how small.    If you are in your 20s or 30s, you should have a clinical breast exam (CBE) by a health care provider every 1-3 years as part of a regular health exam.  If you are 40 or older, have a CBE every year. Also consider having a breast X-ray (mammogram) every year.  If you have a family history of breast cancer, talk to your health care provider about genetic screening.  If you are at high risk for breast cancer, talk to your health care provider about having  an MRI and a mammogram every year.  Breast cancer gene (BRCA) assessment is recommended for women who have family members with BRCA-related cancers. BRCA-related cancers include: ? Breast. ? Ovarian. ? Tubal. ? Peritoneal cancers.  Results of the assessment will determine the need for genetic counseling and BRCA1 and BRCA2 testing. Cervical Cancer Your health care provider may recommend that you be screened regularly for cancer of the pelvic organs (ovaries, uterus, and vagina). This screening involves a pelvic examination, including checking for microscopic changes to the surface of your cervix (Pap test). You may be encouraged to have this screening done every 3 years, beginning at age 21.  For women ages 30-65, health care providers may recommend pelvic exams and Pap testing every 3 years, or they may recommend the Pap and pelvic exam, combined with testing for human papilloma virus (HPV), every 5 years. Some types of HPV increase your risk of cervical cancer. Testing for HPV may also be done on women of any age with unclear Pap test results.  Other health care providers may not recommend any screening for nonpregnant women who are considered low risk for pelvic cancer and who do not have symptoms. Ask your health care provider if a screening pelvic exam is right for you.  If you have had past treatment for cervical cancer or a condition that could lead to cancer, you need Pap tests and screening for cancer for at least 20 years after your treatment. If Pap tests have been discontinued, your risk factors (such as having a new sexual partner) need to be reassessed to determine if screening should resume. Some women have medical problems that increase the chance of getting cervical cancer. In these cases, your health care provider may recommend more frequent screening and Pap tests. Colorectal Cancer  This type of cancer can be detected and often prevented.  Routine colorectal cancer screening  usually begins at 50 years of age and continues through 49 years of age.  Your health care provider may recommend screening at an earlier age if you have risk factors for colon cancer.  Your health care provider may also recommend using home test kits to check for hidden blood in the stool.  A small camera at the end of a tube can be used to examine your colon directly (sigmoidoscopy or colonoscopy). This is done to check for the earliest forms of colorectal cancer.  Routine screening usually begins at age 50.  Direct examination of the colon should be repeated every 5-10 years through 49 years of age. However, you may need to be screened more often if early forms of precancerous polyps or small growths are found. Skin Cancer  Check your skin from head to toe regularly.  Tell your health care provider about any new moles or changes in moles, especially if there is a change in a mole's shape or color.  Also tell your health care provider if you have a mole that is larger than the   size of a pencil eraser.  Always use sunscreen. Apply sunscreen liberally and repeatedly throughout the day.  Protect yourself by wearing long sleeves, pants, a wide-brimmed hat, and sunglasses whenever you are outside. Heart disease, diabetes, and high blood pressure  High blood pressure causes heart disease and increases the risk of stroke. High blood pressure is more likely to develop in: ? People who have blood pressure in the high end of the normal range (130-139/85-89 mm Hg). ? People who are overweight or obese. ? People who are African American.  If you are 18-39 years of age, have your blood pressure checked every 3-5 years. If you are 40 years of age or older, have your blood pressure checked every year. You should have your blood pressure measured twice-once when you are at a hospital or clinic, and once when you are not at a hospital or clinic. Record the average of the two measurements. To check your  blood pressure when you are not at a hospital or clinic, you can use: ? An automated blood pressure machine at a pharmacy. ? A home blood pressure monitor.  If you are between 55 years and 79 years old, ask your health care provider if you should take aspirin to prevent strokes.  Have regular diabetes screenings. This involves taking a blood sample to check your fasting blood sugar level. ? If you are at a normal weight and have a low risk for diabetes, have this test once every three years after 49 years of age. ? If you are overweight and have a high risk for diabetes, consider being tested at a younger age or more often. Preventing infection Hepatitis B  If you have a higher risk for hepatitis B, you should be screened for this virus. You are considered at high risk for hepatitis B if: ? You were born in a country where hepatitis B is common. Ask your health care provider which countries are considered high risk. ? Your parents were born in a high-risk country, and you have not been immunized against hepatitis B (hepatitis B vaccine). ? You have HIV or AIDS. ? You use needles to inject street drugs. ? You live with someone who has hepatitis B. ? You have had sex with someone who has hepatitis B. ? You get hemodialysis treatment. ? You take certain medicines for conditions, including cancer, organ transplantation, and autoimmune conditions. Hepatitis C  Blood testing is recommended for: ? Everyone born from 1945 through 1965. ? Anyone with known risk factors for hepatitis C. Sexually transmitted infections (STIs)  You should be screened for sexually transmitted infections (STIs) including gonorrhea and chlamydia if: ? You are sexually active and are younger than 49 years of age. ? You are older than 49 years of age and your health care provider tells you that you are at risk for this type of infection. ? Your sexual activity has changed since you were last screened and you are at an  increased risk for chlamydia or gonorrhea. Ask your health care provider if you are at risk.  If you do not have HIV, but are at risk, it may be recommended that you take a prescription medicine daily to prevent HIV infection. This is called pre-exposure prophylaxis (PrEP). You are considered at risk if: ? You are sexually active and do not regularly use condoms or know the HIV status of your partner(s). ? You take drugs by injection. ? You are sexually active with a partner who has HIV.   Talk with your health care provider about whether you are at high risk of being infected with HIV. If you choose to begin PrEP, you should first be tested for HIV. You should then be tested every 3 months for as long as you are taking PrEP. Pregnancy  If you are premenopausal and you may become pregnant, ask your health care provider about preconception counseling.  If you may become pregnant, take 400 to 800 micrograms (mcg) of folic acid every day.  If you want to prevent pregnancy, talk to your health care provider about birth control (contraception). Osteoporosis and menopause  Osteoporosis is a disease in which the bones lose minerals and strength with aging. This can result in serious bone fractures. Your risk for osteoporosis can be identified using a bone density scan.  If you are 65 years of age or older, or if you are at risk for osteoporosis and fractures, ask your health care provider if you should be screened.  Ask your health care provider whether you should take a calcium or vitamin D supplement to lower your risk for osteoporosis.  Menopause may have certain physical symptoms and risks.  Hormone replacement therapy may reduce some of these symptoms and risks. Talk to your health care provider about whether hormone replacement therapy is right for you. Follow these instructions at home:  Schedule regular health, dental, and eye exams.  Stay current with your immunizations.  Do not use  any tobacco products including cigarettes, chewing tobacco, or electronic cigarettes.  If you are pregnant, do not drink alcohol.  If you are breastfeeding, limit how much and how often you drink alcohol.  Limit alcohol intake to no more than 1 drink per day for nonpregnant women. One drink equals 12 ounces of beer, 5 ounces of wine, or 1 ounces of hard liquor.  Do not use street drugs.  Do not share needles.  Ask your health care provider for help if you need support or information about quitting drugs.  Tell your health care provider if you often feel depressed.  Tell your health care provider if you have ever been abused or do not feel safe at home. This information is not intended to replace advice given to you by your health care provider. Make sure you discuss any questions you have with your health care provider. Document Released: 10/17/2010 Document Revised: 09/09/2015 Document Reviewed: 01/05/2015 Elsevier Interactive Patient Education  2019 [employer]  

## 2018-09-11 NOTE — Progress Notes (Signed)
Subjective:    Patient ID: Sara Potter, female    DOB: 01/26/1971, 48 y.o.   MRN: [id number]  HPI She is here for a physical exam.   Discoloration under right eye.  She does not think it has change in the past few months.    Medications and allergies reviewed with patient and updated if appropriate.  Patient Active Problem List   Diagnosis Date Noted  . ADHD (attention deficit hyperactivity disorder), combined type 08/14/2014  . Vitamin D deficiency 08/16/2012    Current Outpatient Medications on File Prior to Visit  Medication Sig Dispense Refill  . Acetylcysteine (NAC PO) Take by mouth.    Marland Kitchen b complex vitamins tablet Take 1 tablet by mouth daily.    . Calcium 200 MG TABS Take by mouth daily.    . Cholecalciferol (VITAMIN D3 PO) Take by mouth.    . Flaxseed, Linseed, (FLAX SEED OIL PO) Take by mouth daily.    . IRON PO Take by mouth daily.    Marland Kitchen levonorgestrel (MIRENA, 52 MG,) 20 MCG/24HR IUD 1 each by Intrauterine route once.    . Multiple Vitamin (MULTIVITAMIN) tablet Take 1 tablet by mouth daily.    . Probiotic Product (PROBIOTIC ACIDOPHILUS BEADS PO) Take by mouth daily.    Marland Kitchen QUERCETIN PO Take by mouth.     No current facility-administered medications on file prior to visit.     Past Medical History:  Diagnosis Date  . Abnormal Pap smear of cervix    just a repeat a long time ago  . Depression   . Hypertension   . STD (sexually transmitted disease)    chlamydia age 35  . Urinary incontinence     Past Surgical History:  Procedure Laterality Date  . APPENDECTOMY    . BREAST EXCISIONAL BIOPSY    . DILATION AND CURETTAGE OF UTERUS     age 66  . INTRAUTERINE DEVICE INSERTION     mirena removed & re-inserted 06-16-16  . TONSILLECTOMY AND ADENOIDECTOMY    . TYMPANOSTOMY TUBE PLACEMENT      Social History   Socioeconomic History  . Marital status: Married    Spouse name: Not on file  . Number of children: Not on file  . Years of education: Not on file   . Highest education level: Not on file  Occupational History  . Not on file  Social Needs  . Financial resource strain: Not on file  . Food insecurity:    Worry: Not on file    Inability: Not on file  . Transportation needs:    Medical: Not on file    Non-medical: Not on file  Tobacco Use  . Smoking status: Never Smoker  . Smokeless tobacco: Never Used  Substance and Sexual Activity  . Alcohol use: Yes    Alcohol/week: 5.0 standard drinks    Types: 5 Standard drinks or equivalent per week  . Drug use: No  . Sexual activity: Yes    Partners: Female, Female    Birth control/protection: I.U.D.    Comment: married to female--Mirena IUD inserted 06-16-16  Lifestyle  . Physical activity:    Days per week: Not on file    Minutes per session: Not on file  . Stress: Not on file  Relationships  . Social connections:    Talks on phone: Not on file    Gets together: Not on file    Attends religious service: Not on file    Active member  of club or organization: Not on file    Attends meetings of clubs or organizations: Not on file    Relationship status: Not on file  Other Topics Concern  . Not on file  Social History Narrative   Exercises daily - yoga, bike, jump rope, stairs, treadmill   Mental health therapist   Married   No children    Family History  Problem Relation Age of Onset  . Hypertension Mother   . Osteoporosis Mother   . Alzheimer's disease Mother   . Cancer Sister        ? unsure if she does  . Hypertension Sister   . Osteopenia Father   . Heart attack Maternal Grandfather   . Osteoporosis Maternal Aunt   . Osteoporosis Maternal Grandmother   . Osteoporosis Paternal Grandmother   . Heart disease Paternal Grandfather   . Breast cancer Neg Hx     Review of Systems  Constitutional: Negative for chills and fever.  Eyes: Negative for visual disturbance.  Respiratory: Negative for cough, shortness of breath and wheezing.   Cardiovascular: Negative for chest  pain, palpitations and leg swelling.  Gastrointestinal: Negative for abdominal pain, blood in stool, constipation, diarrhea and nausea.       No gerd  Genitourinary: Negative for dysuria and hematuria.  Musculoskeletal: Negative for arthralgias, back pain and myalgias.  Skin: Positive for color change (under right eye).  Neurological: Negative for dizziness, light-headedness, numbness and headaches.  Psychiatric/Behavioral: Negative for dysphoric mood. The patient is not nervous/anxious.        Objective:   Vitals:   09/12/18 0752  BP: 118/74  Pulse: 63  Resp: 16  Temp: 98.6 F (37 C)  SpO2: 99%   Filed Weights   09/12/18 0752  Weight: 119 lb 1.9 oz (54 kg)   Body mass index is 22.32 kg/m.  BP Readings from Last 3 Encounters:  09/12/18 118/74  12/14/17 116/60  09/06/17 114/84    Wt Readings from Last 3 Encounters:  09/12/18 119 lb 1.9 oz (54 kg)  12/14/17 122 lb (55.3 kg)  09/06/17 125 lb (56.7 kg)     Physical Exam Constitutional: She appears well-developed and well-nourished. No distress.  HENT:  Head: Normocephalic and atraumatic.  Right Ear: External ear normal. Normal ear canal and TM Left Ear: External ear normal.  Normal ear canal and TM Mouth/Throat: Oropharynx is clear and moist.  Eyes: Conjunctivae and EOM are normal.  Neck: Neck supple. No tracheal deviation present. No thyromegaly present.  No carotid bruit  Cardiovascular: Normal rate, regular rhythm and normal heart sounds.   No murmur heard.  No edema. Pulmonary/Chest: Effort normal and breath sounds normal. No respiratory distress. She has no wheezes. She has no rales.  Breast: deferred   Abdominal: Soft. She exhibits no distension. There is no tenderness.  Lymphadenopathy: She has no cervical adenopathy.  Skin: Skin is warm and dry. She is not diaphoretic. Small freckle under eye - normal appearing Psychiatric: She has a normal mood and affect. Her behavior is normal.        Assessment &  Plan:   Physical exam: Screening blood work ordered Immunizations  Up to date  Mammogram  Up to date  Gyn   Up to date  Eye exams   Up to date  Exercise   Daily - weights, yoga, rowing, bicycle, jump rope, running Weight  Normal BMI Skin   Area under right eye - looks benign advised to monitor and if  it changes advised she should see derm Substance abuse   none  See Problem List for Assessment and Plan of chronic medical problems.   FU in one year

## 2018-09-12 ENCOUNTER — Other Ambulatory Visit: Payer: Self-pay

## 2018-09-12 ENCOUNTER — Encounter: Payer: Self-pay | Admitting: Internal Medicine

## 2018-09-12 ENCOUNTER — Other Ambulatory Visit (INDEPENDENT_AMBULATORY_CARE_PROVIDER_SITE_OTHER): Payer: BLUE CROSS/BLUE SHIELD

## 2018-09-12 ENCOUNTER — Ambulatory Visit (INDEPENDENT_AMBULATORY_CARE_PROVIDER_SITE_OTHER): Payer: BLUE CROSS/BLUE SHIELD | Admitting: Internal Medicine

## 2018-09-12 VITALS — BP 118/74 | HR 63 | Temp 98.6°F | Resp 16 | Ht 61.25 in | Wt 119.1 lb

## 2018-09-12 DIAGNOSIS — Z0000 Encounter for general adult medical examination without abnormal findings: Secondary | ICD-10-CM

## 2018-09-12 LAB — CBC WITH DIFFERENTIAL/PLATELET
Basophils Absolute: 0.1 10*3/uL (ref 0.0–0.1)
Basophils Relative: 1.5 % (ref 0.0–3.0)
Eosinophils Absolute: 0 10*3/uL (ref 0.0–0.7)
Eosinophils Relative: 0.5 % (ref 0.0–5.0)
HCT: 40 % (ref 36.0–46.0)
Hemoglobin: 13.9 g/dL (ref 12.0–15.0)
Lymphocytes Relative: 27.7 % (ref 12.0–46.0)
Lymphs Abs: 1.4 10*3/uL (ref 0.7–4.0)
MCHC: 34.7 g/dL (ref 30.0–36.0)
MCV: 93.5 fl (ref 78.0–100.0)
Monocytes Absolute: 0.4 10*3/uL (ref 0.1–1.0)
Monocytes Relative: 9.1 % (ref 3.0–12.0)
Neutro Abs: 3 10*3/uL (ref 1.4–7.7)
Neutrophils Relative %: 61.2 % (ref 43.0–77.0)
Platelets: 269 10*3/uL (ref 150.0–400.0)
RBC: 4.28 Mil/uL (ref 3.87–5.11)
RDW: 13.4 % (ref 11.5–15.5)
WBC: 4.9 10*3/uL (ref 4.0–10.5)

## 2018-09-12 LAB — COMPREHENSIVE METABOLIC PANEL
ALT: 15 U/L (ref 0–35)
AST: 17 U/L (ref 0–37)
Albumin: 4.3 g/dL (ref 3.5–5.2)
Alkaline Phosphatase: 42 U/L (ref 39–117)
BUN: 7 mg/dL (ref 6–23)
CO2: 25 mEq/L (ref 19–32)
Calcium: 8.9 mg/dL (ref 8.4–10.5)
Chloride: 104 mEq/L (ref 96–112)
Creatinine, Ser: 0.68 mg/dL (ref 0.40–1.20)
GFR: 92.15 mL/min (ref >60.00)
Glucose, Bld: 88 mg/dL (ref 70–99)
Potassium: 4.3 mEq/L (ref 3.5–5.1)
Sodium: 138 mEq/L (ref 135–145)
Total Bilirubin: 0.6 mg/dL (ref 0.2–1.2)
Total Protein: 6.7 g/dL (ref 6.0–8.3)

## 2018-09-12 LAB — LIPID PANEL
Cholesterol: 180 mg/dL (ref 0–200)
HDL: 86 mg/dL (ref >39.00)
LDL Cholesterol: 77 mg/dL (ref 0–99)
NonHDL: 94.41
Total CHOL/HDL Ratio: 2
Triglycerides: 85 mg/dL (ref 0.0–149.0)
VLDL: 17 mg/dL (ref 0.0–40.0)

## 2018-09-12 LAB — TSH: TSH: 2.19 u[IU]/mL (ref 0.35–4.50)

## 2018-11-28 ENCOUNTER — Other Ambulatory Visit: Payer: Self-pay | Admitting: Certified Nurse Midwife

## 2018-11-28 DIAGNOSIS — Z1231 Encounter for screening mammogram for malignant neoplasm of breast: Secondary | ICD-10-CM

## 2018-12-02 ENCOUNTER — Telehealth: Payer: Self-pay | Admitting: Certified Nurse Midwife

## 2018-12-02 NOTE — Telephone Encounter (Signed)
Left message on voicemail to call and reschedule cancelled appointment. °

## 2018-12-20 ENCOUNTER — Ambulatory Visit: Payer: BLUE CROSS/BLUE SHIELD | Admitting: Certified Nurse Midwife

## 2019-01-01 ENCOUNTER — Other Ambulatory Visit: Payer: Self-pay

## 2019-01-03 ENCOUNTER — Ambulatory Visit: Payer: BC Managed Care – PPO | Admitting: Certified Nurse Midwife

## 2019-01-08 ENCOUNTER — Other Ambulatory Visit: Payer: Self-pay

## 2019-01-10 ENCOUNTER — Ambulatory Visit: Payer: BC Managed Care – PPO | Admitting: Certified Nurse Midwife

## 2019-01-10 ENCOUNTER — Other Ambulatory Visit: Payer: Self-pay

## 2019-01-10 ENCOUNTER — Encounter: Payer: Self-pay | Admitting: Certified Nurse Midwife

## 2019-01-10 VITALS — BP 102/62 | HR 68 | Temp 97.2°F | Resp 16 | Ht 61.25 in | Wt 115.0 lb

## 2019-01-10 DIAGNOSIS — Z01419 Encounter for gynecological examination (general) (routine) without abnormal findings: Secondary | ICD-10-CM | POA: Diagnosis not present

## 2019-01-10 DIAGNOSIS — Z30431 Encounter for routine checking of intrauterine contraceptive device: Secondary | ICD-10-CM | POA: Diagnosis not present

## 2019-01-10 NOTE — Progress Notes (Signed)
48 y.o. G0P0000 Married  Caucasian Fe here for annual exam. Has not noted weight loss, eating well and doing snacks in between meals. Contraception IUD working well, due for removal in 2023. Mammogram scheduled for next week. Saw Sara Potter in 08/2018 for aex, labs done, all normal. No health issues today.  No LMP recorded. (Menstrual status: IUD).          Sexually active: Yes.    The current method of family planning is IUD.    Exercising: Yes.    yoga, weights, rowing machine, bike Smoker:  no  Review of Systems  Constitutional: Negative.   HENT: Negative.   Eyes: Negative.   Respiratory: Negative.   Cardiovascular: Negative.   Gastrointestinal: Negative.   Genitourinary: Negative.   Musculoskeletal: Negative.   Skin: Negative.   Neurological: Negative.   Endo/Heme/Allergies: Negative.   Psychiatric/Behavioral: Negative.     Health Maintenance: Pap:  10-08-15 neg, 12-14-17 neg HPV HR neg History of Abnormal Pap: yes MMG:  01-11-18 category c density birads 1:neg Self Breast exams: yes Colonoscopy: none BMD:   none TDaP:  2016 Shingles: no Pneumonia: no Hep C and HIV: HIV neg 2017 Labs: if needed.   reports that she has never smoked. She has never used smokeless tobacco. She reports current alcohol use of about 5.0 standard drinks of alcohol per week. She reports that she does not use drugs.  Past Medical History:  Diagnosis Date  . Abnormal Pap smear of cervix    just a repeat a long time ago  . Depression   . Hypertension   . STD (sexually transmitted disease)    chlamydia age 73  . Urinary incontinence     Past Surgical History:  Procedure Laterality Date  . APPENDECTOMY    . BREAST EXCISIONAL BIOPSY    . DILATION AND CURETTAGE OF UTERUS     age 40  . INTRAUTERINE DEVICE INSERTION     mirena removed & re-inserted 06-16-16  . TONSILLECTOMY AND ADENOIDECTOMY    . TYMPANOSTOMY TUBE PLACEMENT      Current Outpatient Medications  Medication Sig Dispense Refill   . Acetylcysteine (NAC PO) Take by mouth.    Marland Kitchen b complex vitamins tablet Take 1 tablet by mouth daily.    . Calcium 200 MG TABS Take by mouth daily.    . Cholecalciferol (VITAMIN D3 PO) Take by mouth.    . Flaxseed, Linseed, (FLAX SEED OIL PO) Take by mouth daily.    . IRON PO Take by mouth daily.    Marland Kitchen levonorgestrel (MIRENA, 52 MG,) 20 MCG/24HR IUD 1 each by Intrauterine route once.    . Multiple Vitamin (MULTIVITAMIN) tablet Take 1 tablet by mouth daily.    . Probiotic Product (PROBIOTIC ACIDOPHILUS BEADS PO) Take by mouth daily.    Marland Kitchen QUERCETIN PO Take by mouth.     No current facility-administered medications for this visit.     Family History  Problem Relation Age of Onset  . Hypertension Mother   . Osteoporosis Mother   . Alzheimer's disease Mother   . Cancer Sister        ? unsure if she does  . Hypertension Sister   . Osteopenia Father   . Heart attack Maternal Grandfather   . Osteoporosis Maternal Aunt   . Osteoporosis Maternal Grandmother   . Osteoporosis Paternal Grandmother   . Heart disease Paternal Grandfather   . Breast cancer Neg Hx     ROS:  Pertinent items are  noted in HPI.  Otherwise, a comprehensive ROS was negative.  Exam:   There were no vitals taken for this visit.   Ht Readings from Last 3 Encounters:  09/12/18 5' 1.25" (1.556 m)  12/14/17 5' 1.25" (1.556 m)  09/06/17 5' 1.5" (1.562 m)    General appearance: alert, cooperative and appears stated age Head: Normocephalic, without obvious abnormality, atraumatic Neck: no adenopathy, supple, symmetrical, trachea midline and thyroid normal to inspection and palpation Lungs: clear to auscultation bilaterally Breasts: normal appearance, no masses or tenderness, No nipple retraction or dimpling, No nipple discharge or bleeding, No axillary or supraclavicular adenopathy Heart: regular rate and rhythm Abdomen: soft, non-tender; no masses,  no organomegaly Extremities: extremities normal, atraumatic, no  cyanosis or edema Skin: Skin color, texture, turgor normal. No rashes or lesions Lymph nodes: Cervical, supraclavicular, and axillary nodes normal. No abnormal inguinal nodes palpated Neurologic: Grossly normal   Pelvic: External genitalia:  no lesions              Urethra:  normal appearing urethra with no masses, tenderness or lesions              Bartholin's and Skene's: normal                 Vagina: normal appearing vagina with normal color and discharge, no lesions              Cervix: no cervical motion tenderness, no lesions and IUD string noted in cervix              Pap taken: No. Bimanual Exam:  Uterus:  normal size, contour, position, consistency, mobility, non-tender and anteflexed              Adnexa: normal adnexa and no mass, fullness, tenderness               Rectovaginal: Confirms               Anus:  normal sphincter tone, no lesions  Chaperone present: yes  A:  Well Woman with normal exam  Contraception Mirena IUD due for removal in 2023  Vitamin D, deficiency with PCP management  Mammogram due , has scheduled  P:   Reviewed health and wellness pertinent to exam  Aware of bleeding profile with IUD. Warning signs reviewed with IUD use.    Continue follow up with PCP as indicated  Pap smear: no   counseled on breast self exam, mammography screening, feminine hygiene, adequate intake of calcium and vitamin D, diet and exercise  return annually or prn  An After Visit Summary was printed and given to the patient.

## 2019-01-10 NOTE — Patient Instructions (Signed)
EXERCISE AND DIET:  We recommended that you start or continue a regular exercise program for good health. Regular exercise means any activity that makes your heart beat faster and makes you sweat.  We recommend exercising at least 30 minutes per day at least 3 days a week, preferably 4 or 5.  We also recommend a diet low in fat and sugar.  Inactivity, poor dietary choices and obesity can cause diabetes, heart attack, stroke, and kidney damage, among others.    ALCOHOL AND SMOKING:  Women should limit their alcohol intake to no more than 7 drinks/beers/glasses of wine (combined, not each!) per week. Moderation of alcohol intake to this level decreases your risk of breast cancer and liver damage. And of course, no recreational drugs are part of a healthy lifestyle.  And absolutely no smoking or even second hand smoke. Most people know smoking can cause heart and lung diseases, but did you know it also contributes to weakening of your bones? Aging of your skin?  Yellowing of your teeth and nails?  CALCIUM AND VITAMIN D:  Adequate intake of calcium and Vitamin D are recommended.  The recommendations for exact amounts of these supplements seem to change often, but generally speaking 600 mg of calcium (either carbonate or citrate) and 800 units of Vitamin D per day seems prudent. Certain women may benefit from higher intake of Vitamin D.  If you are among these women, your doctor will have told you during your visit.    PAP SMEARS:  Pap smears, to check for cervical cancer or precancers,  have traditionally been done yearly, although recent scientific advances have shown that most women can have pap smears less often.  However, every woman still should have a physical exam from her gynecologist every year. It will include a breast check, inspection of the vulva and vagina to check for abnormal growths or skin changes, a visual exam of the cervix, and then an exam to evaluate the size and shape of the uterus and  ovaries.  And after 49 years of age, a rectal exam is indicated to check for rectal cancers. We will also provide age appropriate advice regarding health maintenance, like when you should have certain vaccines, screening for sexually transmitted diseases, bone density testing, colonoscopy, mammograms, etc.   MAMMOGRAMS:  All women over 40 years old should have a yearly mammogram. Many facilities now offer a "3D" mammogram, which may cost around $50 extra out of pocket. If possible,  we recommend you accept the option to have the 3D mammogram performed.  It both reduces the number of women who will be called back for extra views which then turn out to be normal, and it is better than the routine mammogram at detecting truly abnormal areas.    COLONOSCOPY:  Colonoscopy to screen for colon cancer is recommended for all women at age 50.  We know, you hate the idea of the prep.  We agree, BUT, having colon cancer and not knowing it is worse!!  Colon cancer so often starts as a polyp that can be seen and removed at colonscopy, which can quite literally save your life!  And if your first colonoscopy is normal and you have no family history of colon cancer, most women don't have to have it again for 10 years.  Once every ten years, you can do something that may end up saving your life, right?  We will be happy to help you get it scheduled when you are ready.    Be sure to check your insurance coverage so you understand how much it will cost.  It may be covered as a preventative service at no cost, but you should check your particular policy.     Levonorgestrel intrauterine device (IUD) What is this medicine? LEVONORGESTREL IUD (LEE voe nor jes trel) is a contraceptive (birth control) device. The device is placed inside the uterus by a healthcare professional. It is used to prevent pregnancy. This device can also be used to treat heavy bleeding that occurs during your period. This medicine may be used for other  purposes; ask your health care provider or pharmacist if you have questions. COMMON BRAND NAME(S): Kyleena, LILETTA, Mirena, Skyla What should I tell my health care provider before I take this medicine? They need to know if you have any of these conditions:  abnormal Pap smear  cancer of the breast, uterus, or cervix  diabetes  endometritis  genital or pelvic infection now or in the past  have more than one sexual partner or your partner has more than one partner  heart disease  history of an ectopic or tubal pregnancy  immune system problems  IUD in place  liver disease or tumor  problems with blood clots or take blood-thinners  seizures  use intravenous drugs  uterus of unusual shape  vaginal bleeding that has not been explained  an unusual or allergic reaction to levonorgestrel, other hormones, silicone, or polyethylene, medicines, foods, dyes, or preservatives  pregnant or trying to get pregnant  breast-feeding How should I use this medicine? This device is placed inside the uterus by a health care professional. Talk to your pediatrician regarding the use of this medicine in children. Special care may be needed. Overdosage: If you think you have taken too much of this medicine contact a poison control center or emergency room at once. NOTE: This medicine is only for you. Do not share this medicine with others. What if I miss a dose? This does not apply. Depending on the brand of device you have inserted, the device will need to be replaced every 3 to 6 years if you wish to continue using this type of birth control. What may interact with this medicine? Do not take this medicine with any of the following medications:  amprenavir  bosentan  fosamprenavir This medicine may also interact with the following medications:  aprepitant  armodafinil  barbiturate medicines for inducing sleep or treating  seizures  bexarotene  boceprevir  griseofulvin  medicines to treat seizures like carbamazepine, ethotoin, felbamate, oxcarbazepine, phenytoin, topiramate  modafinil  pioglitazone  rifabutin  rifampin  rifapentine  some medicines to treat HIV infection like atazanavir, efavirenz, indinavir, lopinavir, nelfinavir, tipranavir, ritonavir  St. John's wort  warfarin This list may not describe all possible interactions. Give your health care provider a list of all the medicines, herbs, non-prescription drugs, or dietary supplements you use. Also tell them if you smoke, drink alcohol, or use illegal drugs. Some items may interact with your medicine. What should I watch for while using this medicine? Visit your doctor or health care professional for regular check ups. See your doctor if you or your partner has sexual contact with others, becomes HIV positive, or gets a sexual transmitted disease. This product does not protect you against HIV infection (AIDS) or other sexually transmitted diseases. You can check the placement of the IUD yourself by reaching up to the top of your vagina with clean fingers to feel the threads. Do not pull   on the threads. It is a good habit to check placement after each menstrual period. Call your doctor right away if you feel more of the IUD than just the threads or if you cannot feel the threads at all. The IUD may come out by itself. You may become pregnant if the device comes out. If you notice that the IUD has come out use a backup birth control method like condoms and call your health care provider. Using tampons will not change the position of the IUD and are okay to use during your period. This IUD can be safely scanned with magnetic resonance imaging (MRI) only under specific conditions. Before you have an MRI, tell your healthcare provider that you have an IUD in place, and which type of IUD you have in place. What side effects may I notice from  receiving this medicine? Side effects that you should report to your doctor or health care professional as soon as possible:  allergic reactions like skin rash, itching or hives, swelling of the face, lips, or tongue  fever, flu-like symptoms  genital sores  high blood pressure  no menstrual period for 6 weeks during use  pain, swelling, warmth in the leg  pelvic pain or tenderness  severe or sudden headache  signs of pregnancy  stomach cramping  sudden shortness of breath  trouble with balance, talking, or walking  unusual vaginal bleeding, discharge  yellowing of the eyes or skin Side effects that usually do not require medical attention (report to your doctor or health care professional if they continue or are bothersome):  acne  breast pain  change in sex drive or performance  changes in weight  cramping, dizziness, or faintness while the device is being inserted  headache  irregular menstrual bleeding within first 3 to 6 months of use  nausea This list may not describe all possible side effects. Call your doctor for medical advice about side effects. You may report side effects to FDA at 1-800-FDA-1088. Where should I keep my medicine? This does not apply. NOTE: This sheet is a summary. It may not cover all possible information. If you have questions about this medicine, talk to your doctor, pharmacist, or health care provider.  2020 Elsevier/Gold Standard (2018-02-12 13:22:01)  

## 2019-01-17 ENCOUNTER — Other Ambulatory Visit: Payer: Self-pay

## 2019-01-17 ENCOUNTER — Ambulatory Visit
Admission: RE | Admit: 2019-01-17 | Discharge: 2019-01-17 | Disposition: A | Discharge status: Home / Self Care | Payer: BC Managed Care – PPO | Source: Ambulatory Visit | Attending: Certified Nurse Midwife | Admitting: Certified Nurse Midwife

## 2019-01-17 DIAGNOSIS — Z1231 Encounter for screening mammogram for malignant neoplasm of breast: Secondary | ICD-10-CM

## 2019-02-14 DIAGNOSIS — F909 Attention-deficit hyperactivity disorder, unspecified type: Secondary | ICD-10-CM | POA: Diagnosis not present

## 2019-02-28 DIAGNOSIS — F909 Attention-deficit hyperactivity disorder, unspecified type: Secondary | ICD-10-CM | POA: Diagnosis not present

## 2019-03-01 DIAGNOSIS — Z20828 Contact with and (suspected) exposure to other viral communicable diseases: Secondary | ICD-10-CM | POA: Diagnosis not present

## 2019-03-06 IMAGING — MG DIGITAL SCREENING BILATERAL MAMMOGRAM WITH TOMO AND CAD
8 series · 9 of 24 positions shown · non-contrast
Comparison: Previous exam(s).

CLINICAL DATA: Screening.

EXAM:
DIGITAL SCREENING BILATERAL MAMMOGRAM WITH TOMO AND CAD

[R MLO synth-2D]
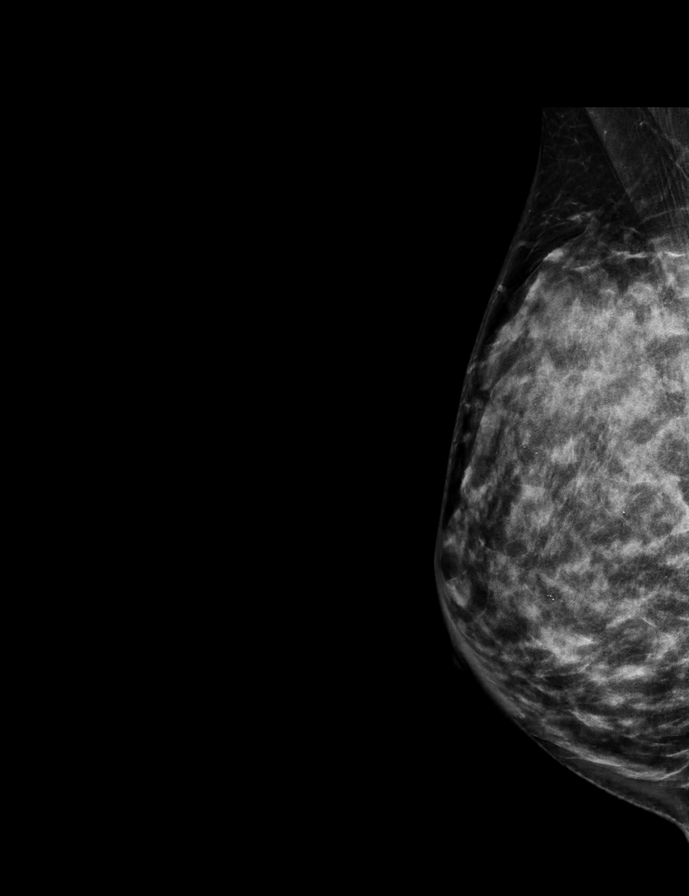

[R CC synth-2D]
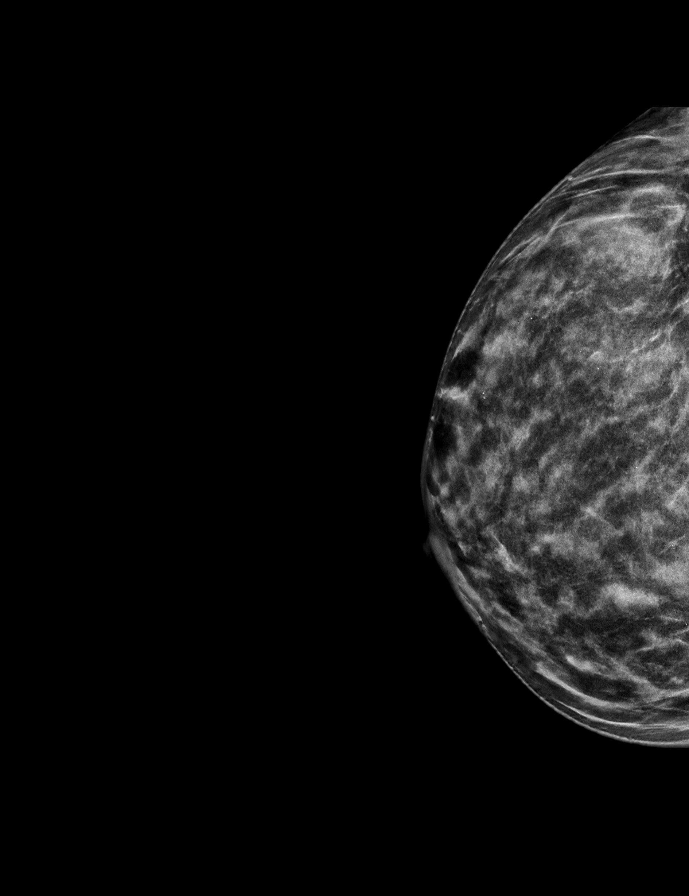

[L CC synth-2D]
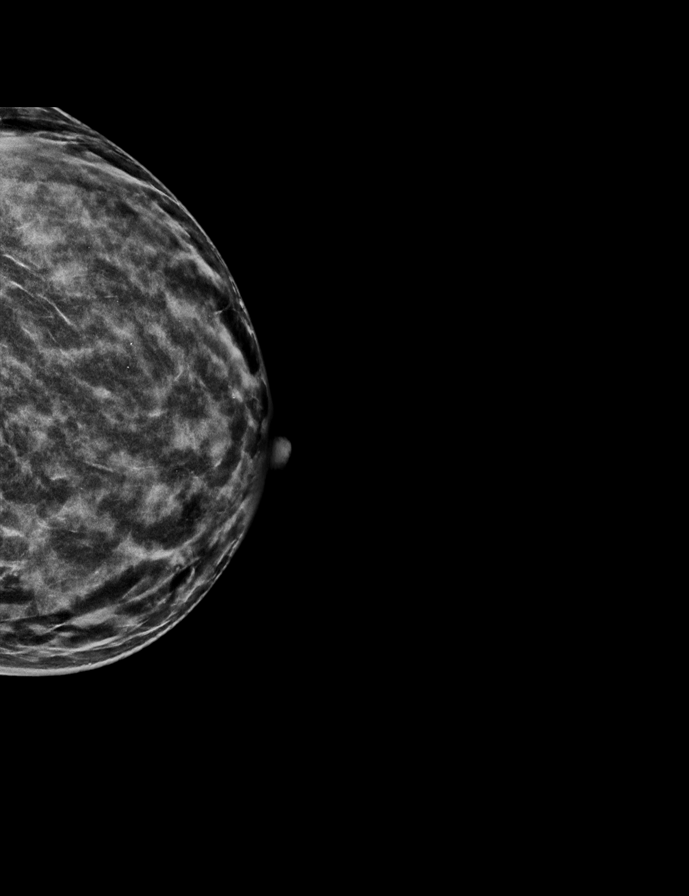

[L MLO synth-2D]
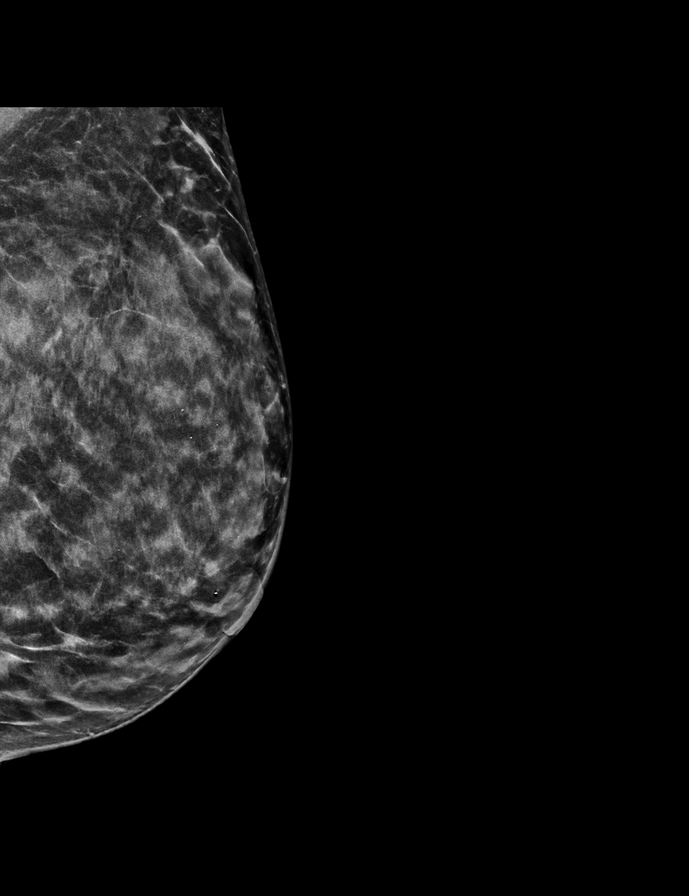

[L MLO tomo · 2 of 54 frames shown]
[frame 18/54]
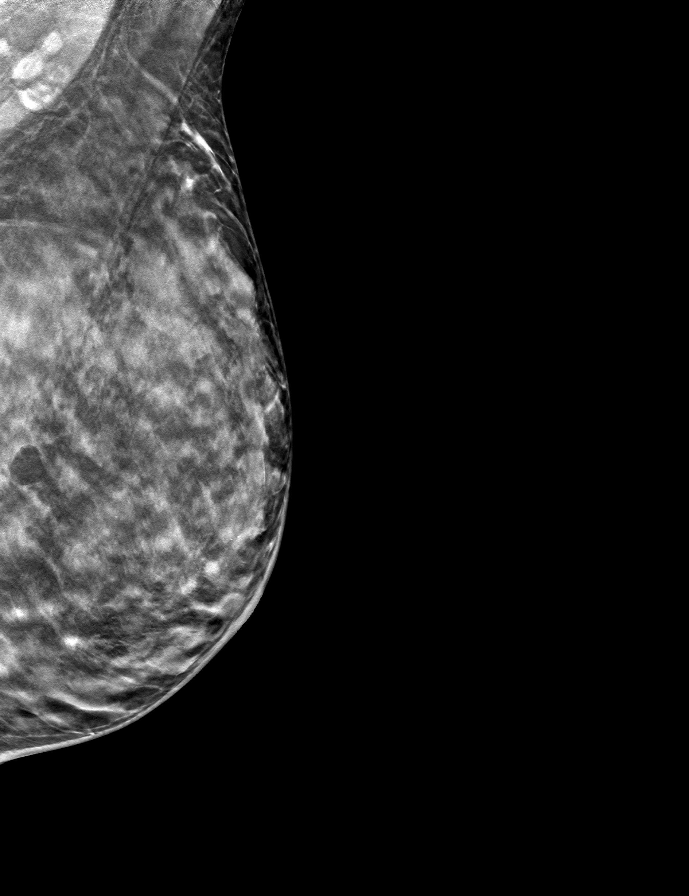
[frame 27/54]
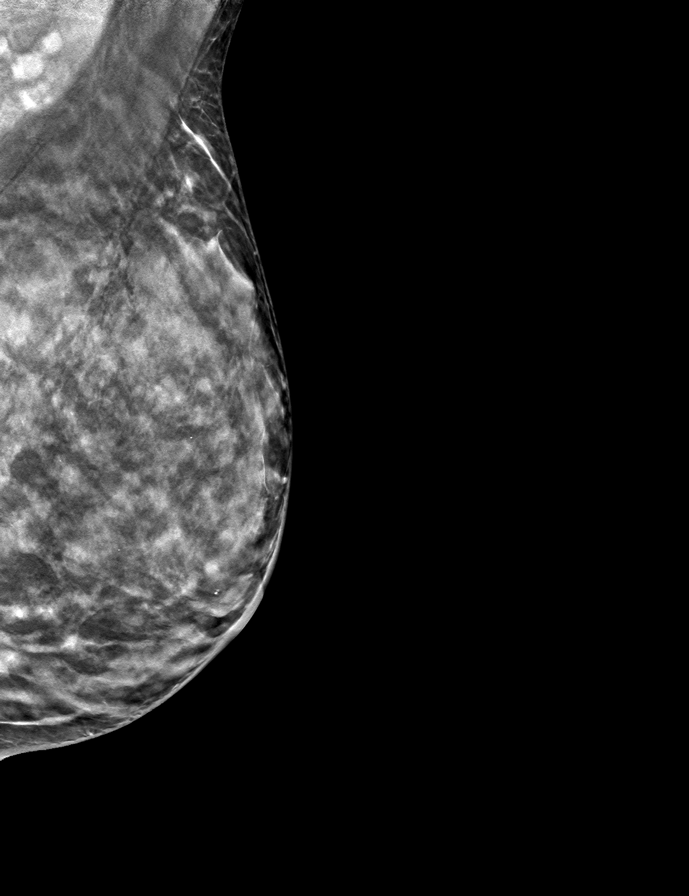

[R MLO tomo · tomo slice 31/61.0]
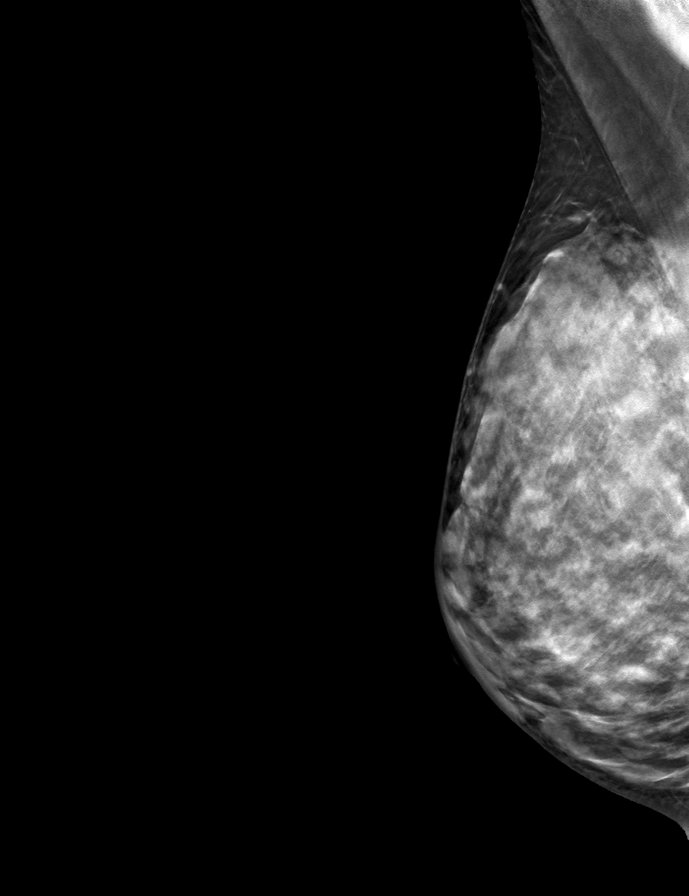

[L CC tomo · tomo slice 27/54.0]
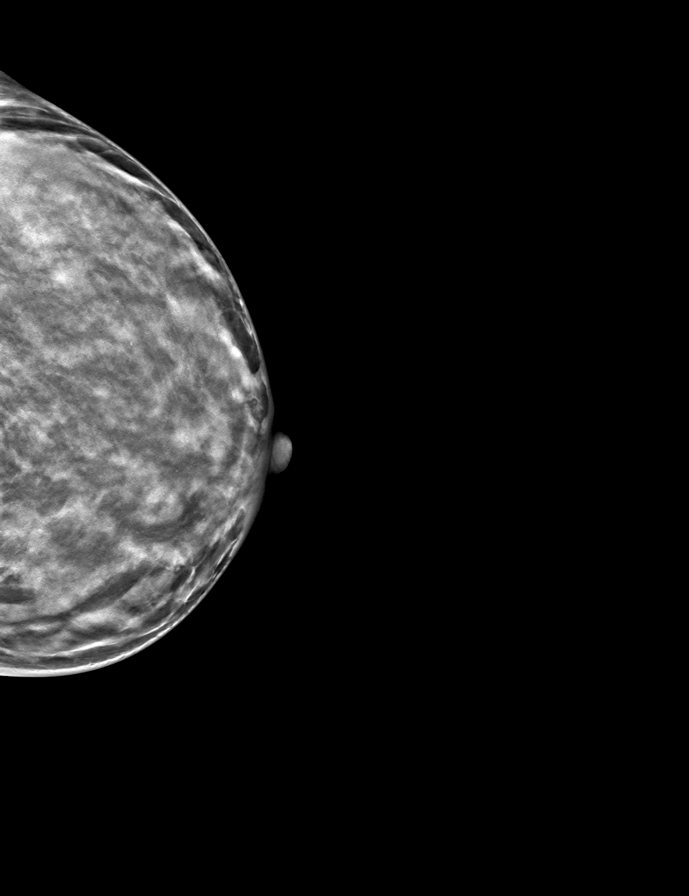

[R CC tomo · tomo slice 29/56.0]
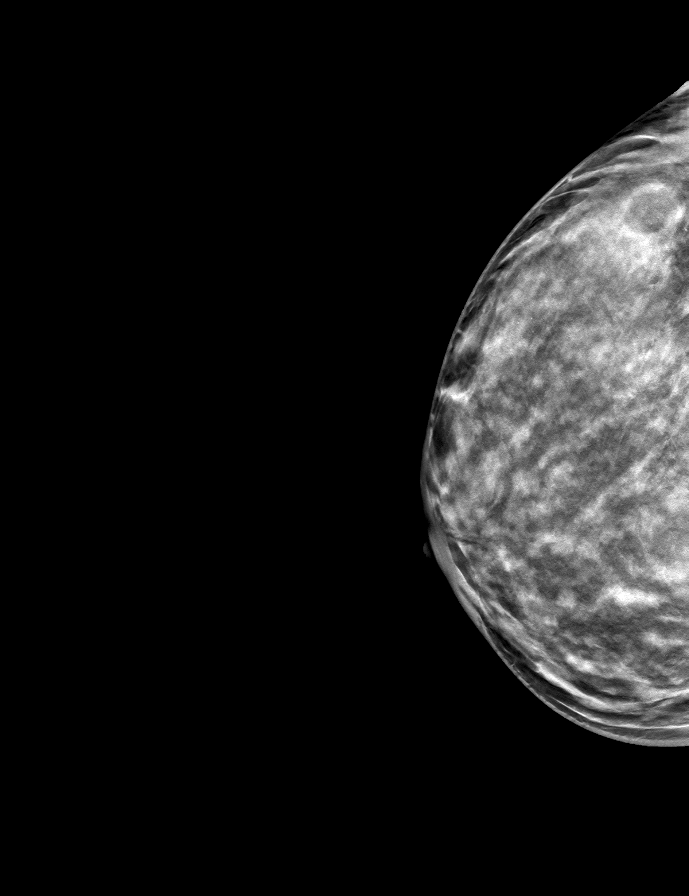

[9 of 24 positions shown; findings below may reference images not displayed]

ACR Breast Density Category c: The breast tissue is heterogeneously
dense, which may obscure small masses.
FINDINGS: There are no findings suspicious for malignancy. Images were
processed with CAD.
IMPRESSION: No mammographic evidence of malignancy. A result letter of this
screening mammogram will be mailed directly to the patient.

RECOMMENDATION:
Screening mammogram in one year. (Code:[id number]-[id number])

BI-RADS CATEGORY  1: Negative.

## 2019-07-07 ENCOUNTER — Encounter: Payer: Self-pay | Admitting: Certified Nurse Midwife

## 2019-09-14 NOTE — Progress Notes (Signed)
Subjective:    Patient ID: Sara Potter, female    DOB: 01-01-1971, 49 y.o.   MRN: [id number]  HPI She is here for a physical exam.   She has started to experience some hotflashes.  She denies other changes in her health.   Medications and allergies reviewed with patient and updated if appropriate.  Patient Active Problem List   Diagnosis Date Noted  . ADHD (attention deficit hyperactivity disorder), combined type 08/14/2014  . Vitamin D deficiency 08/16/2012    Current Outpatient Medications on File Prior to Visit  Medication Sig Dispense Refill  . Acetylcysteine (NAC PO) Take by mouth.    . AMERICAN GINSENG PO     . b complex vitamins tablet Take 1 tablet by mouth daily.    . Calcium 200 MG TABS Take by mouth daily.    . Cholecalciferol (VITAMIN D3 PO) Take by mouth.    . Flaxseed, Linseed, (FLAX SEED OIL PO) Take by mouth daily.    . Ginkgo 60 MG TABS     . IRON PO Take by mouth daily.    Marland Kitchen levonorgestrel (MIRENA, 52 MG,) 20 MCG/24HR IUD 1 each by Intrauterine route once.    . Multiple Vitamin (MULTIVITAMIN) tablet Take 1 tablet by mouth daily.    . Probiotic Product (PROBIOTIC ACIDOPHILUS BEADS PO) Take by mouth daily.    Marland Kitchen QUERCETIN PO Take by mouth.    . vitamin E 400 UNIT/15ML LIQD      No current facility-administered medications on file prior to visit.    Past Medical History:  Diagnosis Date  . Abnormal Pap smear of cervix    just a repeat a long time ago  . Depression   . Hypertension   . STD (sexually transmitted disease)    chlamydia age 66  . Urinary incontinence     Past Surgical History:  Procedure Laterality Date  . APPENDECTOMY    . BREAST EXCISIONAL BIOPSY    . DILATION AND CURETTAGE OF UTERUS     age 38  . INTRAUTERINE DEVICE INSERTION     mirena removed & re-inserted 06-16-16  . TONSILLECTOMY AND ADENOIDECTOMY    . TYMPANOSTOMY TUBE PLACEMENT      Social History   Socioeconomic History  . Marital status: Married    Spouse  name: Not on file  . Number of children: Not on file  . Years of education: Not on file  . Highest education level: Not on file  Occupational History  . Not on file  Tobacco Use  . Smoking status: Never Smoker  . Smokeless tobacco: Never Used  Substance and Sexual Activity  . Alcohol use: Yes    Alcohol/week: 5.0 standard drinks    Types: 5 Standard drinks or equivalent per week  . Drug use: No  . Sexual activity: Yes    Partners: Female, Female    Birth control/protection: I.U.D.    Comment: married to female--Mirena IUD inserted 06-16-16  Other Topics Concern  . Not on file  Social History Narrative   Exercises daily - yoga, bike, jump rope, stairs, treadmill   Mental health therapist   Married   No children   Social Determinants of Health   Financial Resource Strain:   . Difficulty of Paying Living Expenses:   Food Insecurity:   . Worried About Charity fundraiser in the Last Year:   . Arboriculturist in the Last Year:   Transportation Needs:   .  Lack of Transportation (Medical):   Marland Kitchen Lack of Transportation (Non-Medical):   Physical Activity:   . Days of Exercise per Week:   . Minutes of Exercise per Session:   Stress:   . Feeling of Stress :   Social Connections:   . Frequency of Communication with Friends and Family:   . Frequency of Social Gatherings with Friends and Family:   . Attends Religious Services:   . Active Member of Clubs or Organizations:   . Attends Archivist Meetings:   Marland Kitchen Marital Status:     Family History  Problem Relation Age of Onset  . Hypertension Mother   . Osteoporosis Mother   . Alzheimer's disease Mother   . Cancer Sister        ? unsure if she does  . Hypertension Sister   . Osteopenia Father   . Heart attack Maternal Grandfather   . Osteoporosis Maternal Aunt   . Osteoporosis Maternal Grandmother   . Osteoporosis Paternal Grandmother   . Heart disease Paternal Grandfather   . Breast cancer Neg Hx     Review of  Systems  Constitutional: Negative for chills, fatigue and fever.       Hot flashes  Eyes: Negative for visual disturbance.  Respiratory: Negative for cough, shortness of breath and wheezing.   Cardiovascular: Negative for chest pain, palpitations and leg swelling.  Gastrointestinal: Negative for abdominal pain, blood in stool, constipation, diarrhea and nausea.       No gerd  Genitourinary: Negative for dysuria and hematuria.  Musculoskeletal: Positive for back pain (occ lower). Negative for arthralgias.  Skin: Negative for color change and rash.  Neurological: Positive for headaches (with hot flashes). Negative for dizziness and light-headedness.  Psychiatric/Behavioral: Negative for dysphoric mood and sleep disturbance. The patient is not nervous/anxious.        Objective:   Vitals:   09/16/19 0755  BP: 106/66  Pulse: (!) 57  Resp: 16  Temp: 98.1 F (36.7 C)  SpO2: 99%   Filed Weights   09/16/19 0755  Weight: 119 lb 6.4 oz (54.2 kg)   Body mass index is 22.38 kg/m.  BP Readings from Last 3 Encounters:  09/16/19 106/66  01/10/19 102/62  09/12/18 118/74    Wt Readings from Last 3 Encounters:  09/16/19 119 lb 6.4 oz (54.2 kg)  01/10/19 115 lb (52.2 kg)  09/12/18 119 lb 1.9 oz (54 kg)     Physical Exam Constitutional: She appears well-developed and well-nourished. No distress.  HENT:  Head: Normocephalic and atraumatic.  Right Ear: External ear normal. Normal ear canal and TM Left Ear: External ear normal.  Normal ear canal and TM Mouth/Throat: Oropharynx is clear and moist.  Eyes: Conjunctivae and EOM are normal.  Neck: Neck supple. No tracheal deviation present. No thyromegaly present.  No carotid bruit  Cardiovascular: Normal rate, regular rhythm and normal heart sounds.   No murmur heard.  No edema. Pulmonary/Chest: Effort normal and breath sounds normal. No respiratory distress. She has no wheezes. She has no rales.  Breast: deferred   Abdominal: Soft.  She exhibits no distension. There is no tenderness.  Lymphadenopathy: She has no cervical adenopathy.  Skin: Skin is warm and dry. She is not diaphoretic.  Psychiatric: She has a normal mood and affect. Her behavior is normal.        Assessment & Plan:   Physical exam: Screening blood work    ordered Immunizations  Up to date  Colonoscopy  -  discussed  Mammogram  Up to date  Gyn  Up to date  Eye exams  Up to date  Exercise  Regular - weights, yoga, rowing bike, running Weight    Normal BMI Substance abuse  none  See Problem List for Assessment and Plan of chronic medical problems.     This visit occurred during the SARS-CoV-2 public health emergency.  Safety protocols were in place, including screening questions prior to the visit, additional usage of staff PPE, and extensive cleaning of exam room while observing appropriate contact time as indicated for disinfecting solutions.

## 2019-09-14 NOTE — Patient Instructions (Addendum)
Blood work was ordered.    All other Health Maintenance issues reviewed.   All recommended immunizations and age-appropriate screenings are up-to-date or discussed.  No immunization administered today.   Medications reviewed and updated.  Changes include :   none     Please followup in 1 year    Health Maintenance, Female Adopting a healthy lifestyle and getting preventive care are important in promoting health and wellness. Ask your health care provider about:  The right schedule for you to have regular tests and exams.  Things you can do on your own to prevent diseases and keep yourself healthy. What should I know about diet, weight, and exercise? Eat a healthy diet   Eat a diet that includes plenty of vegetables, fruits, low-fat dairy products, and lean protein.  Do not eat a lot of foods that are high in solid fats, added sugars, or sodium. Maintain a healthy weight Body mass index (BMI) is used to identify weight problems. It estimates body fat based on height and weight. Your health care provider can help determine your BMI and help you achieve or maintain a healthy weight. Get regular exercise Get regular exercise. This is one of the most important things you can do for your health. Most adults should:  Exercise for at least 150 minutes each week. The exercise should increase your heart rate and make you sweat (moderate-intensity exercise).  Do strengthening exercises at least twice a week. This is in addition to the moderate-intensity exercise.  Spend less time sitting. Even light physical activity can be beneficial. Watch cholesterol and blood lipids Have your blood tested for lipids and cholesterol at 50 years of age, then have this test every 5 years. Have your cholesterol levels checked more often if:  Your lipid or cholesterol levels are high.  You are older than 50 years of age.  You are at high risk for heart disease. What should I know about cancer  screening? Depending on your health history and family history, you may need to have cancer screening at various ages. This may include screening for:  Breast cancer.  Cervical cancer.  Colorectal cancer.  Skin cancer.  Lung cancer. What should I know about heart disease, diabetes, and high blood pressure? Blood pressure and heart disease  High blood pressure causes heart disease and increases the risk of stroke. This is more likely to develop in people who have high blood pressure readings, are of African descent, or are overweight.  Have your blood pressure checked: ? Every 3-5 years if you are 18-39 years of age. ? Every year if you are 40 years old or older. Diabetes Have regular diabetes screenings. This checks your fasting blood sugar level. Have the screening done:  Once every three years after age 40 if you are at a normal weight and have a low risk for diabetes.  More often and at a younger age if you are overweight or have a high risk for diabetes. What should I know about preventing infection? Hepatitis B If you have a higher risk for hepatitis B, you should be screened for this virus. Talk with your health care provider to find out if you are at risk for hepatitis B infection. Hepatitis C Testing is recommended for:  Everyone born from 1945 through 1965.  Anyone with known risk factors for hepatitis C. Sexually transmitted infections (STIs)  Get screened for STIs, including gonorrhea and chlamydia, if: ? You are sexually active and are younger than 50 years   of age. ? You are older than 50 years of age and your health care provider tells you that you are at risk for this type of infection. ? Your sexual activity has changed since you were last screened, and you are at increased risk for chlamydia or gonorrhea. Ask your health care provider if you are at risk.  Ask your health care provider about whether you are at high risk for HIV. Your health care provider may  recommend a prescription medicine to help prevent HIV infection. If you choose to take medicine to prevent HIV, you should first get tested for HIV. You should then be tested every 3 months for as long as you are taking the medicine. Pregnancy  If you are about to stop having your period (premenopausal) and you may become pregnant, seek counseling before you get pregnant.  Take 400 to 800 micrograms (mcg) of folic acid every day if you become pregnant.  Ask for birth control (contraception) if you want to prevent pregnancy. Osteoporosis and menopause Osteoporosis is a disease in which the bones lose minerals and strength with aging. This can result in bone fractures. If you are 65 years old or older, or if you are at risk for osteoporosis and fractures, ask your health care provider if you should:  Be screened for bone loss.  Take a calcium or vitamin D supplement to lower your risk of fractures.  Be given hormone replacement therapy (HRT) to treat symptoms of menopause. Follow these instructions at home: Lifestyle  Do not use any products that contain nicotine or tobacco, such as cigarettes, e-cigarettes, and chewing tobacco. If you need help quitting, ask your health care provider.  Do not use street drugs.  Do not share needles.  Ask your health care provider for help if you need support or information about quitting drugs. Alcohol use  Do not drink alcohol if: ? Your health care provider tells you not to drink. ? You are pregnant, may be pregnant, or are planning to become pregnant.  If you drink alcohol: ? Limit how much you use to 0-1 drink a day. ? Limit intake if you are breastfeeding.  Be aware of how much alcohol is in your drink. In the [place] one drink equals one 12 oz bottle of beer (355 mL), one 5 oz glass of wine (148 mL), or one 1 oz glass of hard liquor (44 mL). General instructions  Schedule regular health, dental, and eye exams.  Stay current with your  vaccines.  Tell your health care provider if: ? You often feel depressed. ? You have ever been abused or do not feel safe at home. Summary  Adopting a healthy lifestyle and getting preventive care are important in promoting health and wellness.  Follow your health care provider's instructions about healthy diet, exercising, and getting tested or screened for diseases.  Follow your health care provider's instructions on monitoring your cholesterol and blood pressure. This information is not intended to replace advice given to you by your health care provider. Make sure you discuss any questions you have with your health care provider. Document Revised: 03/27/2018 Document Reviewed: 03/27/2018 Elsevier Patient Education  2020 [employer] Inc.  

## 2019-09-16 ENCOUNTER — Ambulatory Visit (INDEPENDENT_AMBULATORY_CARE_PROVIDER_SITE_OTHER): Payer: BC Managed Care – PPO | Admitting: Internal Medicine

## 2019-09-16 ENCOUNTER — Other Ambulatory Visit: Payer: Self-pay

## 2019-09-16 ENCOUNTER — Encounter: Payer: Self-pay | Admitting: Internal Medicine

## 2019-09-16 VITALS — BP 106/66 | HR 57 | Temp 98.1°F | Resp 16 | Ht 61.25 in | Wt 119.4 lb

## 2019-09-16 DIAGNOSIS — E559 Vitamin D deficiency, unspecified: Secondary | ICD-10-CM | POA: Diagnosis not present

## 2019-09-16 DIAGNOSIS — Z0000 Encounter for general adult medical examination without abnormal findings: Secondary | ICD-10-CM | POA: Diagnosis not present

## 2019-09-16 LAB — LIPID PANEL
Cholesterol: 185 mg/dL (ref 0–200)
HDL: 94.2 mg/dL (ref >39.00)
LDL Cholesterol: 79 mg/dL (ref 0–99)
NonHDL: 91.27
Total CHOL/HDL Ratio: 2
Triglycerides: 59 mg/dL (ref 0.0–149.0)
VLDL: 11.8 mg/dL (ref 0.0–40.0)

## 2019-09-16 LAB — COMPREHENSIVE METABOLIC PANEL
ALT: 16 U/L (ref 0–35)
AST: 18 U/L (ref 0–37)
Albumin: 4.5 g/dL (ref 3.5–5.2)
Alkaline Phosphatase: 44 U/L (ref 39–117)
BUN: 8 mg/dL (ref 6–23)
CO2: 26 mEq/L (ref 19–32)
Calcium: 9.3 mg/dL (ref 8.4–10.5)
Chloride: 103 mEq/L (ref 96–112)
Creatinine, Ser: 0.7 mg/dL (ref 0.40–1.20)
GFR: 88.74 mL/min (ref >60.00)
Glucose, Bld: 83 mg/dL (ref 70–99)
Potassium: 4.1 mEq/L (ref 3.5–5.1)
Sodium: 138 mEq/L (ref 135–145)
Total Bilirubin: 0.6 mg/dL (ref 0.2–1.2)
Total Protein: 6.8 g/dL (ref 6.0–8.3)

## 2019-09-16 LAB — VITAMIN D 25 HYDROXY (VIT D DEFICIENCY, FRACTURES): VITD: 72.23 ng/mL (ref 30.00–100.00)

## 2019-09-16 LAB — CBC WITH DIFFERENTIAL/PLATELET
Basophils Absolute: 0.1 10*3/uL (ref 0.0–0.1)
Basophils Relative: 2.5 % (ref 0.0–3.0)
Eosinophils Absolute: 0.1 10*3/uL (ref 0.0–0.7)
Eosinophils Relative: 1.3 % (ref 0.0–5.0)
HCT: 39 % (ref 36.0–46.0)
Hemoglobin: 13.6 g/dL (ref 12.0–15.0)
Lymphocytes Relative: 32.3 % (ref 12.0–46.0)
Lymphs Abs: 1.3 10*3/uL (ref 0.7–4.0)
MCHC: 34.8 g/dL (ref 30.0–36.0)
MCV: 92.4 fl (ref 78.0–100.0)
Monocytes Absolute: 0.4 10*3/uL (ref 0.1–1.0)
Monocytes Relative: 8.9 % (ref 3.0–12.0)
Neutro Abs: 2.3 10*3/uL (ref 1.4–7.7)
Neutrophils Relative %: 55 % (ref 43.0–77.0)
Platelets: 260 10*3/uL (ref 150.0–400.0)
RBC: 4.22 Mil/uL (ref 3.87–5.11)
RDW: 12.9 % (ref 11.5–15.5)
WBC: 4.2 10*3/uL (ref 4.0–10.5)

## 2019-09-16 LAB — TSH: TSH: 2.46 u[IU]/mL (ref 0.35–4.50)

## 2019-09-16 NOTE — Assessment & Plan Note (Signed)
Chronic Taking vitamin d daily Check level 

## 2019-09-17 ENCOUNTER — Encounter: Payer: Self-pay | Admitting: Internal Medicine

## 2019-12-04 ENCOUNTER — Other Ambulatory Visit: Payer: Self-pay | Admitting: Internal Medicine

## 2019-12-04 DIAGNOSIS — Z1231 Encounter for screening mammogram for malignant neoplasm of breast: Secondary | ICD-10-CM

## 2019-12-19 DIAGNOSIS — H3562 Retinal hemorrhage, left eye: Secondary | ICD-10-CM | POA: Diagnosis not present

## 2020-01-23 ENCOUNTER — Ambulatory Visit
Admission: RE | Admit: 2020-01-23 | Discharge: 2020-01-23 | Disposition: A | Discharge status: Home / Self Care | Payer: BC Managed Care – PPO | Source: Ambulatory Visit | Attending: Internal Medicine | Admitting: Internal Medicine

## 2020-01-23 ENCOUNTER — Other Ambulatory Visit: Payer: Self-pay

## 2020-01-23 ENCOUNTER — Ambulatory Visit: Payer: BC Managed Care – PPO | Admitting: Certified Nurse Midwife

## 2020-01-23 DIAGNOSIS — Z1231 Encounter for screening mammogram for malignant neoplasm of breast: Secondary | ICD-10-CM

## 2020-01-23 DIAGNOSIS — F909 Attention-deficit hyperactivity disorder, unspecified type: Secondary | ICD-10-CM | POA: Diagnosis not present

## 2020-02-10 NOTE — Progress Notes (Signed)
49 y.o. G68P0000 Married Caucasian female here for annual exam.    Patient with hot flashes and vaginal dryness. She began taking vitamin E for hot flashes and "Good" for vaginal dryness. Both seem to be helping some.  No menses since IUD placement.   Mental health counselor.   Has been vaccinated including booster for Covid.   PCP:  Billey Gosling, MD  No LMP recorded. (Menstrual status: IUD).     Period Cycle (Days):  (no cycles with Mirena IUD)     Sexually active: Yes.    The current method of family planning is IUD--Mirena 06/16/16.    Exercising: Yes.    strength yoga, rowing, weights, running, cycling, jump roping and stairstep Smoker:  no  Health Maintenance: Pap: 12-14-17 Neg:Neg HR HPV, 10-08-15 Neg, 05-06-13 Neg:Neg HR HPV History of abnormal Pap: Yes, only had a repeat pap a long time ago--normal. MMG: 01-23-20 3D/Neg/density D/BiRads1 Colonoscopy:  NEVER BMD:   n/a  Result  n/a TDaP: 09-25-14 Gardasil:   no HIV: 2017 NR Hep C:no Screening Labs:  PCP.   reports that she has never smoked. She has never used smokeless tobacco. She reports current alcohol use of about 3.0 standard drinks of alcohol per week. She reports that she does not use drugs.  Past Medical History:  Diagnosis Date  . Abnormal Pap smear of cervix    just a repeat a long time ago  . Depression   . Hypertension   . STD (sexually transmitted disease)    chlamydia age 78  . Urinary incontinence     Past Surgical History:  Procedure Laterality Date  . APPENDECTOMY    . BREAST EXCISIONAL BIOPSY    . DILATION AND CURETTAGE OF UTERUS     age 47  . INTRAUTERINE DEVICE INSERTION     mirena removed & re-inserted 06-16-16  . TONSILLECTOMY AND ADENOIDECTOMY    . TYMPANOSTOMY TUBE PLACEMENT      Current Outpatient Medications  Medication Sig Dispense Refill  . Acetylcysteine (NAC PO) Take by mouth.    . AMERICAN GINSENG PO     . b complex vitamins tablet Take 1 tablet by mouth daily.    . Calcium 200  MG TABS Take by mouth daily.    . Cholecalciferol (VITAMIN D3 PO) Take by mouth.    . Flaxseed, Linseed, (FLAX SEED OIL PO) Take by mouth daily.    . Ginkgo 60 MG TABS     . IRON PO Take by mouth daily.    Marland Kitchen levonorgestrel (MIRENA, 52 MG,) 20 MCG/24HR IUD 1 each by Intrauterine route once.    . Multiple Vitamin (MULTIVITAMIN) tablet Take 1 tablet by mouth daily.    . Probiotic Product (PROBIOTIC ACIDOPHILUS BEADS PO) Take by mouth daily.    Marland Kitchen QUERCETIN PO Take by mouth.    . vitamin E 400 UNIT/15ML LIQD      No current facility-administered medications for this visit.    Family History  Problem Relation Age of Onset  . Hypertension Mother   . Osteoporosis Mother   . Alzheimer's disease Mother   . Cancer Sister        ? unsure if she does  . Hypertension Sister   . Osteopenia Father   . Heart attack Maternal Grandfather   . Osteoporosis Maternal Aunt   . Osteoporosis Maternal Grandmother   . Osteoporosis Paternal Grandmother   . Heart disease Paternal Grandfather   . Breast cancer Neg Hx  Review of Systems  All other systems reviewed and are negative.   Exam:   BP 110/70   Pulse (!) 55   Ht 5' 1.5" (1.562 m)   Wt 122 lb (55.3 kg)   SpO2 98%   BMI 22.68 kg/m     General appearance: alert, cooperative and appears stated age Head: normocephalic, without obvious abnormality, atraumatic Neck: no adenopathy, supple, symmetrical, trachea midline and thyroid normal to inspection and palpation Lungs: clear to auscultation bilaterally Breasts: normal appearance, no masses or tenderness, No nipple retraction or dimpling, No nipple discharge or bleeding, No axillary adenopathy Heart: regular rate and rhythm Abdomen: soft, non-tender; no masses, no organomegaly Extremities: extremities normal, atraumatic, no cyanosis or edema Skin: skin color, texture, turgor normal. No rashes or lesions Lymph nodes: cervical, supraclavicular, and axillary nodes normal. Neurologic: grossly  normal  Pelvic: External genitalia:  no lesions              No abnormal inguinal nodes palpated.              Urethra:  normal appearing urethra with no masses, tenderness or lesions              Bartholins and Skenes: normal                 Vagina: normal appearing vagina with normal color and discharge, no lesions              Cervix: no lesions.  IUD strings seen.              Pap taken: No. Bimanual Exam:  Uterus:  normal size, contour, position, consistency, mobility, non-tender              Adnexa: no mass, fullness, tenderness              Rectal exam: Yes.  .  Confirms.              Anus:  normal sphincter tone, no lesions  Chaperone was present for exam.  Assessment:   Well woman visit with normal exam. Mirena IUD.  Menopausal symptoms.    Plan: Mammogram screening discussed. Self breast awareness reviewed. Pap and HR HPV as above. 7 year efficacy of Mirena discussed. Guidelines for Calcium, Vitamin D, regular exercise program including cardiovascular and weight bearing exercise. OK to try black cohosh and soy. We reviewed estrogen, Paxil, Effexor, and Gabapentin for prescription tx of hot flashes. Follow up annually and prn.

## 2020-02-11 ENCOUNTER — Ambulatory Visit: Payer: BC Managed Care – PPO | Admitting: Obstetrics and Gynecology

## 2020-02-11 ENCOUNTER — Other Ambulatory Visit: Payer: Self-pay

## 2020-02-11 ENCOUNTER — Encounter: Payer: Self-pay | Admitting: Obstetrics and Gynecology

## 2020-02-11 VITALS — BP 110/70 | HR 55 | Ht 61.5 in | Wt 122.0 lb

## 2020-02-11 DIAGNOSIS — Z1211 Encounter for screening for malignant neoplasm of colon: Secondary | ICD-10-CM

## 2020-02-11 DIAGNOSIS — Z01419 Encounter for gynecological examination (general) (routine) without abnormal findings: Secondary | ICD-10-CM | POA: Diagnosis not present

## 2020-02-11 NOTE — Patient Instructions (Signed)

## 2020-04-23 DIAGNOSIS — Z20828 Contact with and (suspected) exposure to other viral communicable diseases: Secondary | ICD-10-CM | POA: Diagnosis not present

## 2020-06-04 ENCOUNTER — Ambulatory Visit (AMBULATORY_SURGERY_CENTER): Payer: Self-pay | Admitting: *Deleted

## 2020-06-04 ENCOUNTER — Other Ambulatory Visit: Payer: Self-pay

## 2020-06-04 ENCOUNTER — Other Ambulatory Visit (HOSPITAL_COMMUNITY): Payer: Self-pay | Admitting: Gastroenterology

## 2020-06-04 VITALS — Ht 61.5 in | Wt 120.0 lb

## 2020-06-04 DIAGNOSIS — Z1211 Encounter for screening for malignant neoplasm of colon: Secondary | ICD-10-CM

## 2020-06-04 MED ORDER — PLENVU 140 G PO SOLR: 1.0000 | ORAL | 0 refills | Status: DC

## 2020-06-04 NOTE — Progress Notes (Signed)
No egg or soy allergy known to patient  No issues with past sedation with any surgeries or procedures No intubation problems in the past  No FH of Malignant Hyperthermia No diet pills per patient No home 02 use per patient  No blood thinners per patient  Pt denies issues with constipation  No A fib or A flutter  EMMI video to pt or via [place] 19 guidelines implemented in PV today with Pt and RN  Pt is fully vaccinated  for Covid   Plenvu  Coupon given to pt in PV today , Code to Pharmacy and  NO PA's for preps discussed with pt In PV today  Discussed with pt there will be an out-of-pocket cost for prep and that varies from $0 to 70 dollars   Due to the COVID-19 pandemic we are asking patients to follow certain guidelines.  Pt aware of COVID protocols and [facility] guidelines

## 2020-06-11 ENCOUNTER — Encounter: Payer: Self-pay | Admitting: Gastroenterology

## 2020-06-15 HISTORY — PX: COLONOSCOPY: SHX174

## 2020-06-18 ENCOUNTER — Encounter: Payer: Self-pay | Admitting: Gastroenterology

## 2020-06-18 ENCOUNTER — Other Ambulatory Visit: Payer: Self-pay

## 2020-06-18 ENCOUNTER — Ambulatory Visit (AMBULATORY_SURGERY_CENTER): Payer: BC Managed Care – PPO | Admitting: Gastroenterology

## 2020-06-18 VITALS — BP 120/68 | HR 67 | Temp 98.2°F | Resp 20

## 2020-06-18 DIAGNOSIS — K635 Polyp of colon: Secondary | ICD-10-CM | POA: Diagnosis not present

## 2020-06-18 DIAGNOSIS — D122 Benign neoplasm of ascending colon: Secondary | ICD-10-CM

## 2020-06-18 DIAGNOSIS — Z1211 Encounter for screening for malignant neoplasm of colon: Secondary | ICD-10-CM

## 2020-06-18 DIAGNOSIS — D123 Benign neoplasm of transverse colon: Secondary | ICD-10-CM | POA: Diagnosis not present

## 2020-06-18 MED ORDER — SODIUM CHLORIDE 0.9 % IV SOLN: 500.0000 mL | Freq: Once | INTRAVENOUS | Status: DC

## 2020-06-18 NOTE — Progress Notes (Signed)
Called to room to assist during endoscopic procedure.  Patient ID and intended procedure confirmed with present staff. Received instructions for my participation in the procedure from the performing physician.  

## 2020-06-18 NOTE — Progress Notes (Signed)
Report given to PACU, vss 

## 2020-06-18 NOTE — Op Note (Signed)
[place] Patient Name: Sara Potter Procedure Date: 06/18/2020 3:58 PM MRN: [id number] Endoscopist: Thornton Park MD, MD Age: 50 Referring MD:  Date of Birth: 20-Dec-1970 Gender: Female Account #: [id number] Procedure:                Colonoscopy Indications:              Screening for colorectal malignant neoplasm, This                            is the patient's first colonoscopy                           No known family history of colon cancer or polyps Medicines:                Monitored Anesthesia Care Procedure:                Pre-Anesthesia Assessment:                           - Prior to the procedure, a History and Physical                            was performed, and patient medications and                            allergies were reviewed. The patient's tolerance of                            previous anesthesia was also reviewed. The risks                            and benefits of the procedure and the sedation                            options and risks were discussed with the patient.                            All questions were answered, and informed consent                            was obtained. Prior Anticoagulants: The patient has                            taken no previous anticoagulant or antiplatelet                            agents. ASA Grade Assessment: II - A patient with                            mild systemic disease. After reviewing the risks                            and benefits, the patient was deemed in  satisfactory condition to undergo the procedure.                           After obtaining informed consent, the colonoscope                            was passed under direct vision. Throughout the                            procedure, the patient's blood pressure, pulse, and                            oxygen saturations were monitored continuously. The                            Olympus CF-HQ190L  ([id number]) Colonoscope was                            introduced through the anus and advanced to the 3                            cm into the ileum. The colonoscopy was somewhat                            difficult due to significant looping and a tortuous                            colon. Successful completion of the procedure was                            aided by applying abdominal pressure. The patient                            tolerated the procedure well. The quality of the                            bowel preparation was excellent. The terminal                            ileum, ileocecal valve, appendiceal orifice, and                            rectum were photographed. Scope In: 4:05:33 PM Scope Out: 4:24:20 PM Scope Withdrawal Time: 0 hours 9 minutes 46 seconds  Total Procedure Duration: 0 hours 18 minutes 47 seconds  Findings:                 The perianal and digital rectal examinations were                            normal.                           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  Four sessile polyps were found in the hepatic                            flexure and ascending colon. The hepatic flexure                            polyps were 1-3 mm and the ascending colon polyps                            were 2-46mm. These polyps were removed with a cold                            snare. Resection and retrieval were complete.                            Estimated blood loss was minimal.                           The exam was otherwise without abnormality on                            direct and retroflexion views. Complications:            No immediate complications. Estimated blood loss:                            Minimal. Estimated Blood Loss:     Estimated blood loss was minimal. Impression:               - Melanotic mucosa in the entire examined colon.                           - Four 2 to 3 mm polyps at the hepatic flexure and                             in the ascending colon, removed with a cold snare.                            Resected and retrieved.                           - The examination was otherwise normal on direct                            and retroflexion views. Recommendation:           - Patient has a contact number available for                            emergencies. The signs and symptoms of potential                            delayed complications were discussed with the  patient. Return to normal activities tomorrow.                            Written discharge instructions were provided to the                            patient.                           - Resume previous diet.                           - Continue present medications.                           - Await pathology results.                           - Repeat colonoscopy date to be determined after                            pending pathology results are reviewed for                            surveillance.                           - Emerging evidence supports eating a diet of                            fruits, vegetables, grains, calcium, and yogurt                            while reducing red meat and alcohol may reduce the                            risk of colon cancer.                           - Thank you for allowing me to be involved in your                            colon cancer prevention. Thornton Park MD, MD 06/18/2020 4:30:20 PM This report has been signed electronically.

## 2020-06-18 NOTE — Patient Instructions (Signed)
Impression/Recommendations:  [place] handout given to patient.  Resume previous diet. Continue present medications. Await pathology results.  Repeat colonoscopy date to be determined after pathology results reviewed.  YOU HAD AN ENDOSCOPIC PROCEDURE TODAY AT [facility]:   Refer to the procedure report that was given to you for any specific questions about what was found during the examination.  If the procedure report does not answer your questions, please call your gastroenterologist to clarify.  If you requested that your care partner not be given the details of your procedure findings, then the procedure report has been included in a sealed envelope for you to review at your convenience later.  YOU SHOULD EXPECT: Some feelings of bloating in the abdomen. Passage of more gas than usual.  Walking can help get rid of the air that was put into your GI tract during the procedure and reduce the bloating. If you had a lower endoscopy (such as a colonoscopy or flexible sigmoidoscopy) you may notice spotting of blood in your stool or on the toilet paper. If you underwent a bowel prep for your procedure, you may not have a normal bowel movement for a few days.  Please Note:  You might notice some irritation and congestion in your nose or some drainage.  This is from the oxygen used during your procedure.  There is no need for concern and it should clear up in a day or so.  SYMPTOMS TO REPORT IMMEDIATELY:   Following lower endoscopy (colonoscopy or flexible sigmoidoscopy):  Excessive amounts of blood in the stool  Significant tenderness or worsening of abdominal pains  Swelling of the abdomen that is new, acute  Fever of 100F or higher  For urgent or emergent issues, a gastroenterologist can be reached at any hour by calling [phone]. Do not use MyChart messaging for urgent concerns.    DIET:  We do recommend a small meal at first, but then you may proceed to your regular  diet.  Drink plenty of fluids but you should avoid alcoholic beverages for 24 hours.  ACTIVITY:  You should plan to take it easy for the rest of today and you should NOT DRIVE or use heavy machinery until tomorrow (because of the sedation medicines used during the test).    FOLLOW UP: Our staff will call the number listed on your records 48-72 hours following your procedure to check on you and address any questions or concerns that you may have regarding the information given to you following your procedure. If we do not reach you, we will leave a message.  We will attempt to reach you two times.  During this call, we will ask if you have developed any symptoms of COVID 19. If you develop any symptoms (ie: fever, flu-like symptoms, shortness of breath, cough etc.) before then, please call [phone].  If you test positive for Covid 19 in the 2 weeks post procedure, please call and report this information to [place].    If any biopsies were taken you will be contacted by phone or by letter within the next 1-3 weeks.  Please call us at [phone] if you have not heard about the biopsies in 3 weeks.    SIGNATURES/CONFIDENTIALITY: You and/or your care partner have signed paperwork which will be entered into your electronic medical record.  These signatures attest to the fact that that the information above on your After Visit Summary has been reviewed and is understood.  Full responsibility of the confidentiality  of this discharge information lies with you and/or your care-partner.

## 2020-06-18 NOTE — Progress Notes (Signed)
[facility]  Pt's states no medical or surgical changes since previsit or office visit.  

## 2020-06-22 ENCOUNTER — Telehealth: Payer: Self-pay

## 2020-06-22 NOTE — Telephone Encounter (Signed)
  Follow up Call-  Call back number 06/18/2020  Post procedure Call Back phone  # [phone]  Permission to leave phone message Yes  Some recent data might be hidden     Patient questions:  Do you have a fever, pain , or abdominal swelling? No. Pain Score  0 *  Have you tolerated food without any problems? Yes.    Have you been able to return to your normal activities? Yes.    Do you have any questions about your discharge instructions: Diet   No. Medications  No. Follow up visit  No.  Do you have questions or concerns about your Care? No.  Actions: * If pain score is 4 or above: No action needed, pain <4. 1. Have you developed a fever since your procedure? no  2.   Have you had an respiratory symptoms (SOB or cough) since your procedure? no  3.   Have you tested positive for COVID 19 since your procedure no  4.   Have you had any family members/close contacts diagnosed with the COVID 19 since your procedure?  no   If yes to any of these questions please route to Joylene John, RN and Joella Prince, RN

## 2020-06-28 ENCOUNTER — Encounter: Payer: Self-pay | Admitting: Gastroenterology

## 2020-09-16 ENCOUNTER — Encounter: Payer: BC Managed Care – PPO | Admitting: Internal Medicine

## 2020-09-23 ENCOUNTER — Encounter: Payer: BC Managed Care – PPO | Admitting: Internal Medicine

## 2020-10-01 ENCOUNTER — Encounter: Payer: BC Managed Care – PPO | Admitting: Internal Medicine

## 2020-10-11 NOTE — Progress Notes (Signed)
Subjective:    Patient ID: Sara Potter, female    DOB: 1970-04-22, 50 y.o.   MRN: [id number]   This visit occurred during the SARS-CoV-2 public health emergency.  Safety protocols were in place, including screening questions prior to the visit, additional usage of staff PPE, and extensive cleaning of exam room while observing appropriate contact time as indicated for disinfecting solutions.    HPI She is here for a physical exam.   She denies changes in her health and has no concerns.  She has had some mild hot flashes.  She is taking supplements and they have helped.   Medications and allergies reviewed with patient and updated if appropriate.  Patient Active Problem List   Diagnosis Date Noted   ADHD (attention deficit hyperactivity disorder), combined type 08/14/2014   Vitamin D deficiency 08/16/2012    Current Outpatient Medications on File Prior to Visit  Medication Sig Dispense Refill   Acetylcysteine (NAC PO) Take by mouth.     b complex vitamins tablet Take 1 tablet by mouth daily.     Calcium 200 MG TABS Take by mouth daily.     Cholecalciferol (VITAMIN D3 PO) Take by mouth.     Ginkgo 60 MG TABS      IRON PO Take by mouth daily.     levonorgestrel (MIRENA) 20 MCG/24HR IUD 1 each by Intrauterine route once.     Multiple Vitamin (MULTIVITAMIN) tablet Take 1 tablet by mouth daily.     Probiotic Product (PROBIOTIC ACIDOPHILUS BEADS PO) Take by mouth daily.     QUERCETIN PO Take by mouth.     vitamin E 400 UNIT/15ML LIQD      No current facility-administered medications on file prior to visit.    Past Medical History:  Diagnosis Date   Abnormal Pap smear of cervix    just a repeat a long time ago   Allergy    no testing    Depression    Hypertension    STD (sexually transmitted disease)    chlamydia age 85   Urinary incontinence     Past Surgical History:  Procedure Laterality Date   APPENDECTOMY     BREAST EXCISIONAL BIOPSY     DILATION AND  CURETTAGE OF UTERUS     age 51   INTRAUTERINE DEVICE INSERTION     mirena removed & re-inserted 06-16-16   TONSILLECTOMY AND ADENOIDECTOMY     TYMPANOSTOMY TUBE PLACEMENT      Social History   Socioeconomic History   Marital status: Married    Spouse name: Not on file   Number of children: Not on file   Years of education: Not on file   Highest education level: Not on file  Occupational History   Not on file  Tobacco Use   Smoking status: Never   Smokeless tobacco: Never  Vaping Use   Vaping Use: Never used  Substance and Sexual Activity   Alcohol use: Yes    Alcohol/week: 3.0 standard drinks    Types: 3 Glasses of wine per week   Drug use: No   Sexual activity: Yes    Partners: Female, Female    Birth control/protection: I.U.D.    Comment: married to female--Mirena IUD inserted 06-16-16  Other Topics Concern   Not on file  Social History Narrative   Exercises daily - yoga, bike, jump rope, stairs, treadmill   Mental health therapist   Married   No children   Social Determinants  of Health   Financial Resource Strain: Not on file  Food Insecurity: Not on file  Transportation Needs: Not on file  Physical Activity: Not on file  Stress: Not on file  Social Connections: Not on file    Family History  Problem Relation Age of Onset   Hypertension Mother    Osteoporosis Mother    Alzheimer's disease Mother    Cancer Sister        ? unsure if she does   Hypertension Sister    Osteopenia Father    Heart attack Maternal Grandfather    Osteoporosis Maternal Aunt    Osteoporosis Maternal Grandmother    Osteoporosis Paternal Grandmother    Heart disease Paternal Grandfather    Breast cancer Neg Hx    Colon cancer Neg Hx    Colon polyps Neg Hx    Esophageal cancer Neg Hx    Rectal cancer Neg Hx    Stomach cancer Neg Hx     Review of Systems  Constitutional:  Negative for chills, fatigue and fever.  Eyes:  Negative for visual disturbance.  Respiratory:  Negative for  cough, shortness of breath and wheezing.   Cardiovascular:  Negative for chest pain, palpitations and leg swelling.  Gastrointestinal:  Negative for abdominal pain, blood in stool, constipation, diarrhea and nausea.       No gerd  Genitourinary:  Negative for dysuria and hematuria.  Musculoskeletal:  Negative for arthralgias and back pain.  Skin:  Negative for color change and rash.  Neurological:  Positive for headaches (occ). Negative for dizziness and light-headedness.  Psychiatric/Behavioral:  Negative for dysphoric mood and sleep disturbance. The patient is not nervous/anxious.       Objective:   Vitals:   10/12/20 0805  BP: 116/70  Pulse: 71  Temp: 98.3 F (36.8 C)  SpO2: 99%   Filed Weights   10/12/20 0805  Weight: 121 lb (54.9 kg)   Body mass index is 22.86 kg/m.  BP Readings from Last 3 Encounters:  10/12/20 116/70  06/18/20 120/68  02/11/20 110/70    Wt Readings from Last 3 Encounters:  10/12/20 121 lb (54.9 kg)  06/04/20 120 lb (54.4 kg)  02/11/20 122 lb (55.3 kg)     Physical Exam Constitutional: She appears well-developed and well-nourished. No distress.  HENT:  Head: Normocephalic and atraumatic.  Right Ear: External ear normal. Normal ear canal and TM Left Ear: External ear normal.  Normal ear canal and TM Mouth/Throat: Oropharynx is clear and moist.  Eyes: Conjunctivae and EOM are normal.  Neck: Neck supple. No tracheal deviation present. No thyromegaly present.  No carotid bruit  Cardiovascular: Normal rate, regular rhythm and normal heart sounds.   No murmur heard.  No edema. Pulmonary/Chest: Effort normal and breath sounds normal. No respiratory distress. She has no wheezes. She has no rales.  Breast: deferred   Abdominal: Soft. She exhibits no distension. There is no tenderness.  Lymphadenopathy: She has no cervical adenopathy.  Skin: Skin is warm and dry. She is not diaphoretic.  Psychiatric: She has a normal mood and affect. Her behavior  is normal.        Assessment & Plan:   Physical exam: Screening blood work    ordered Immunizations  had first shingrix, had both covid boosters Colonoscopy  Up to date  Mammogram   Up to date  Gyn  Up to date  Eye exams   Up to date  Exercise  regular Weight  normal Substance abuse  none       See Problem List for Assessment and Plan of chronic medical problems.

## 2020-10-11 NOTE — Patient Instructions (Addendum)
Blood work was ordered.     Medications changes include :   none     Please followup in 1 year    Health Maintenance, Female Adopting a healthy lifestyle and getting preventive care are important in promoting health and wellness. Ask your health care provider about: The right schedule for you to have regular tests and exams. Things you can do on your own to prevent diseases and keep yourself healthy. What should I know about diet, weight, and exercise? Eat a healthy diet  Eat a diet that includes plenty of vegetables, fruits, low-fat dairy products, and lean protein. Do not eat a lot of foods that are high in solid fats, added sugars, or sodium.  Maintain a healthy weight Body mass index (BMI) is used to identify weight problems. It estimates body fat based on height and weight. Your health care provider can help determineyour BMI and help you achieve or maintain a healthy weight. Get regular exercise Get regular exercise. This is one of the most important things you can do for your health. Most adults should: Exercise for at least 150 minutes each week. The exercise should increase your heart rate and make you sweat (moderate-intensity exercise). Do strengthening exercises at least twice a week. This is in addition to the moderate-intensity exercise. Spend less time sitting. Even light physical activity can be beneficial. Watch cholesterol and blood lipids Have your blood tested for lipids and cholesterol at 51 years of age, then havethis test every 5 years. Have your cholesterol levels checked more often if: Your lipid or cholesterol levels are high. You are older than 51 years of age. You are at high risk for heart disease. What should I know about cancer screening? Depending on your health history and family history, you may need to have cancer screening at various ages. This may include screening for: Breast cancer. Cervical cancer. Colorectal cancer. Skin cancer. Lung  cancer. What should I know about heart disease, diabetes, and high blood pressure? Blood pressure and heart disease High blood pressure causes heart disease and increases the risk of stroke. This is more likely to develop in people who have high blood pressure readings, are of African descent, or are overweight. Have your blood pressure checked: Every 3-5 years if you are 18-39 years of age. Every year if you are 40 years old or older. Diabetes Have regular diabetes screenings. This checks your fasting blood sugar level. Have the screening done: Once every three years after age 40 if you are at a normal weight and have a low risk for diabetes. More often and at a younger age if you are overweight or have a high risk for diabetes. What should I know about preventing infection? Hepatitis B If you have a higher risk for hepatitis B, you should be screened for this virus. Talk with your health care provider to find out if you are at risk forhepatitis B infection. Hepatitis C Testing is recommended for: Everyone born from 1945 through 1965. Anyone with known risk factors for hepatitis C. Sexually transmitted infections (STIs) Get screened for STIs, including gonorrhea and chlamydia, if: You are sexually active and are younger than 51 years of age. You are older than 51 years of age and your health care provider tells you that you are at risk for this type of infection. Your sexual activity has changed since you were last screened, and you are at increased risk for chlamydia or gonorrhea. Ask your health care provider if you are   at risk. Ask your health care provider about whether you are at high risk for HIV. Your health care provider may recommend a prescription medicine to help prevent HIV infection. If you choose to take medicine to prevent HIV, you should first get tested for HIV. You should then be tested every 3 months for as long as you are taking the medicine. Pregnancy If you are about  to stop having your period (premenopausal) and you may become pregnant, seek counseling before you get pregnant. Take 400 to 800 micrograms (mcg) of folic acid every day if you become pregnant. Ask for birth control (contraception) if you want to prevent pregnancy. Osteoporosis and menopause Osteoporosis is a disease in which the bones lose minerals and strength with aging. This can result in bone fractures. If you are 65 years old or older, or if you are at risk for osteoporosis and fractures, ask your health care provider if you should: Be screened for bone loss. Take a calcium or vitamin D supplement to lower your risk of fractures. Be given hormone replacement therapy (HRT) to treat symptoms of menopause. Follow these instructions at home: Lifestyle Do not use any products that contain nicotine or tobacco, such as cigarettes, e-cigarettes, and chewing tobacco. If you need help quitting, ask your health care provider. Do not use street drugs. Do not share needles. Ask your health care provider for help if you need support or information about quitting drugs. Alcohol use Do not drink alcohol if: Your health care provider tells you not to drink. You are pregnant, may be pregnant, or are planning to become pregnant. If you drink alcohol: Limit how much you use to 0-1 drink a day. Limit intake if you are breastfeeding. Be aware of how much alcohol is in your drink. In the [place] one drink equals one 12 oz bottle of beer (355 mL), one 5 oz glass of wine (148 mL), or one 1 oz glass of hard liquor (44 mL). General instructions Schedule regular health, dental, and eye exams. Stay current with your vaccines. Tell your health care provider if: You often feel depressed. You have ever been abused or do not feel safe at home. Summary Adopting a healthy lifestyle and getting preventive care are important in promoting health and wellness. Follow your health care provider's instructions about  healthy diet, exercising, and getting tested or screened for diseases. Follow your health care provider's instructions on monitoring your cholesterol and blood pressure. This information is not intended to replace advice given to you by your health care provider. Make sure you discuss any questions you have with your healthcare provider. Document Revised: 03/27/2018 Document Reviewed: 03/27/2018 Elsevier Patient Education  2022 Elsevier Inc.  

## 2020-10-12 ENCOUNTER — Encounter: Payer: Self-pay | Admitting: Internal Medicine

## 2020-10-12 ENCOUNTER — Other Ambulatory Visit: Payer: Self-pay

## 2020-10-12 ENCOUNTER — Ambulatory Visit (INDEPENDENT_AMBULATORY_CARE_PROVIDER_SITE_OTHER): Payer: BC Managed Care – PPO | Admitting: Internal Medicine

## 2020-10-12 VITALS — BP 116/70 | HR 71 | Temp 98.3°F | Ht 61.0 in | Wt 121.0 lb

## 2020-10-12 DIAGNOSIS — Z0000 Encounter for general adult medical examination without abnormal findings: Secondary | ICD-10-CM

## 2020-10-12 DIAGNOSIS — Z1159 Encounter for screening for other viral diseases: Secondary | ICD-10-CM

## 2020-10-12 DIAGNOSIS — G5603 Carpal tunnel syndrome, bilateral upper limbs: Secondary | ICD-10-CM

## 2020-10-12 DIAGNOSIS — E559 Vitamin D deficiency, unspecified: Secondary | ICD-10-CM | POA: Diagnosis not present

## 2020-10-12 LAB — LIPID PANEL
Cholesterol: 196 mg/dL (ref 0–200)
HDL: 98.6 mg/dL (ref >39.00)
LDL Cholesterol: 85 mg/dL (ref 0–99)
NonHDL: 97
Total CHOL/HDL Ratio: 2
Triglycerides: 62 mg/dL (ref 0.0–149.0)
VLDL: 12.4 mg/dL (ref 0.0–40.0)

## 2020-10-12 LAB — COMPREHENSIVE METABOLIC PANEL
ALT: 15 U/L (ref 0–35)
AST: 16 U/L (ref 0–37)
Albumin: 4.7 g/dL (ref 3.5–5.2)
Alkaline Phosphatase: 53 U/L (ref 39–117)
BUN: 9 mg/dL (ref 6–23)
CO2: 28 mEq/L (ref 19–32)
Calcium: 9.6 mg/dL (ref 8.4–10.5)
Chloride: 105 mEq/L (ref 96–112)
Creatinine, Ser: 0.74 mg/dL (ref 0.40–1.20)
GFR: 94.21 mL/min (ref >60.00)
Glucose, Bld: 89 mg/dL (ref 70–99)
Potassium: 4.4 mEq/L (ref 3.5–5.1)
Sodium: 140 mEq/L (ref 135–145)
Total Bilirubin: 0.8 mg/dL (ref 0.2–1.2)
Total Protein: 7 g/dL (ref 6.0–8.3)

## 2020-10-12 LAB — CBC WITH DIFFERENTIAL/PLATELET
Basophils Absolute: 0.1 10*3/uL (ref 0.0–0.1)
Basophils Relative: 2.1 % (ref 0.0–3.0)
Eosinophils Absolute: 0 10*3/uL (ref 0.0–0.7)
Eosinophils Relative: 1.2 % (ref 0.0–5.0)
HCT: 40.2 % (ref 36.0–46.0)
Hemoglobin: 13.5 g/dL (ref 12.0–15.0)
Lymphocytes Relative: 30.7 % (ref 12.0–46.0)
Lymphs Abs: 1.3 10*3/uL (ref 0.7–4.0)
MCHC: 33.7 g/dL (ref 30.0–36.0)
MCV: 92.9 fl (ref 78.0–100.0)
Monocytes Absolute: 0.3 10*3/uL (ref 0.1–1.0)
Monocytes Relative: 7.2 % (ref 3.0–12.0)
Neutro Abs: 2.5 10*3/uL (ref 1.4–7.7)
Neutrophils Relative %: 58.8 % (ref 43.0–77.0)
Platelets: 265 10*3/uL (ref 150.0–400.0)
RBC: 4.33 Mil/uL (ref 3.87–5.11)
RDW: 13.1 % (ref 11.5–15.5)
WBC: 4.2 10*3/uL (ref 4.0–10.5)

## 2020-10-12 LAB — VITAMIN D 25 HYDROXY (VIT D DEFICIENCY, FRACTURES): VITD: 86.77 ng/mL (ref 30.00–100.00)

## 2020-10-12 LAB — TSH: TSH: 2.03 u[IU]/mL (ref 0.35–4.50)

## 2020-10-12 NOTE — Assessment & Plan Note (Addendum)
Chronic Taking vitamin d daily Will check vitamin D level

## 2020-10-12 NOTE — Assessment & Plan Note (Addendum)
Chronic Bilateral Wearing braces nightly Has intermittent symptoms - no symptoms right now Will me know if she wants referral to ortho in the near future

## 2020-10-13 LAB — HEPATITIS C ANTIBODY
Hepatitis C Ab: NONREACTIVE
SIGNAL TO CUT-OFF: 0.01 (ref <1.00)

## 2020-12-09 ENCOUNTER — Other Ambulatory Visit: Payer: Self-pay | Admitting: Internal Medicine

## 2020-12-09 DIAGNOSIS — Z1231 Encounter for screening mammogram for malignant neoplasm of breast: Secondary | ICD-10-CM

## 2021-02-04 ENCOUNTER — Ambulatory Visit
Admission: RE | Admit: 2021-02-04 | Discharge: 2021-02-04 | Disposition: A | Discharge status: Home / Self Care | Payer: BC Managed Care – PPO | Source: Ambulatory Visit | Attending: Internal Medicine | Admitting: Internal Medicine

## 2021-02-04 ENCOUNTER — Other Ambulatory Visit: Payer: Self-pay

## 2021-02-04 DIAGNOSIS — Z1231 Encounter for screening mammogram for malignant neoplasm of breast: Secondary | ICD-10-CM

## 2021-02-15 ENCOUNTER — Ambulatory Visit: Payer: BC Managed Care – PPO | Admitting: Obstetrics and Gynecology

## 2021-04-27 NOTE — Progress Notes (Signed)
51 y.o. G78P0000 Married Caucasian female here for annual exam.    Sex is becoming painful. Hot flashes for a while.  Using some vit E and using a cooling pillow.   Takes black cohosh for about a year.  Using boric acid suppositories twice a week.   PCP:   Billey Gosling, MD  No LMP recorded. (Menstrual status: IUD).           Sexually active: Yes.    The current method of family planning is IUD-Mirena 06/16/16.    Exercising: Yes.    Home exercise. Smoker:  no  Health Maintenance: Pap:  12-14-17 Neg:Neg HR HPV, 10-08-15 Neg, 05-06-13 Neg:Neg HR HPV History of abnormal Pap:  Yes, only had a repeat pap a long time ago--normal. MMG:  02-04-21 Neg/Birads1 Colonoscopy: 06-18-20 polyps;next due 3 years. BMD:   n/a  Result  n/a TDaP:  09-25-14 Gardasil:   no HIV: 09-02-15 NR Hep C: 10-12-20 Neg Screening Labs:  PCP. Flu vaccine:  completed. Covid vaccine and 2 boosters + Bivalent booster.   reports that she has never smoked. She has never used smokeless tobacco. She reports current alcohol use of about 3.0 standard drinks per week. She reports that she does not use drugs.  Past Medical History:  Diagnosis Date   Abnormal Pap smear of cervix    just a repeat a long time ago   Allergy    no testing    Depression    Hypertension    STD (sexually transmitted disease)    chlamydia age 37   Urinary incontinence     Past Surgical History:  Procedure Laterality Date   APPENDECTOMY     BREAST EXCISIONAL BIOPSY Right 2000   DILATION AND CURETTAGE OF UTERUS     age 49   INTRAUTERINE DEVICE INSERTION     mirena removed & re-inserted 06-16-16   TONSILLECTOMY AND ADENOIDECTOMY     TYMPANOSTOMY TUBE PLACEMENT      Current Outpatient Medications  Medication Sig Dispense Refill   Acetylcysteine (NAC PO) Take by mouth.     b complex vitamins tablet Take 1 tablet by mouth daily.     Calcium 200 MG TABS Take by mouth daily.     Cholecalciferol (VITAMIN D3 PO) Take by mouth.     Ginkgo 60 MG  TABS      IRON PO Take by mouth daily.     levonorgestrel (MIRENA) 20 MCG/24HR IUD 1 each by Intrauterine route once.     Multiple Vitamin (MULTIVITAMIN) tablet Take 1 tablet by mouth daily.     Probiotic Product (PROBIOTIC ACIDOPHILUS BEADS PO) Take by mouth daily.     QUERCETIN PO Take by mouth.     vitamin E 400 UNIT/15ML LIQD      No current facility-administered medications for this visit.    Family History  Problem Relation Age of Onset   Hypertension Mother    Osteoporosis Mother    Alzheimer's disease Mother    Cancer Sister        ? unsure if she does   Hypertension Sister    Osteopenia Father    Heart attack Maternal Grandfather    Osteoporosis Maternal Aunt    Osteoporosis Maternal Grandmother    Osteoporosis Paternal Grandmother    Heart disease Paternal Grandfather    Breast cancer Neg Hx    Colon cancer Neg Hx    Colon polyps Neg Hx    Esophageal cancer Neg Hx    Rectal  cancer Neg Hx    Stomach cancer Neg Hx     Review of Systems  Genitourinary:        Vaginal dryness   Exam:   There were no vitals taken for this visit.    General appearance: alert, cooperative and appears stated age Head: normocephalic, without obvious abnormality, atraumatic Neck: no adenopathy, supple, symmetrical, trachea midline and thyroid normal to inspection and palpation Lungs: clear to auscultation bilaterally Breasts: normal appearance, no masses or tenderness, No nipple retraction or dimpling, No nipple discharge or bleeding, No axillary adenopathy Heart: regular rate and rhythm Abdomen: soft, non-tender; no masses, no organomegaly Extremities: extremities normal, atraumatic, no cyanosis or edema Skin: skin color, texture, turgor normal. No rashes or lesions Lymph nodes: cervical, supraclavicular, and axillary nodes normal. Neurologic: grossly normal  Pelvic: External genitalia:  no lesions              No abnormal inguinal nodes palpated.              Urethra:  normal  appearing urethra with no masses, tenderness or lesions              Bartholins and Skenes: normal                 Vagina: normal appearing vagina with normal color and discharge, no lesions              Cervix: no lesions.  IUD strings noted.              Pap taken: no Bimanual Exam:  Uterus:  normal size, contour, position, consistency, mobility, non-tender              Adnexa: no mass, fullness, tenderness              Rectal exam: yes.  Confirms.              Anus:  normal sphincter tone, no lesions  Chaperone was present for exam:  Estill Bamberg, CMA  Assessment:   Well woman visit with gynecologic exam. Mirena IUD.  Menopausal symptoms.  Vaginal atrophy.   Plan: Mammogram screening discussed. Self breast awareness reviewed. Pap and HR HPV as above. Guidelines for Calcium, Vitamin D, regular exercise program including cardiovascular and weight bearing exercise. We discussed treatment of vasomotor symptoms:  HRT, Paxil ,Effexor, Neurontin, herbal remedies.   We also reviewed vaginal estrogen options of cream, tablet, or ring. She opts for Estring.  Instructed in use.  I discussed potential effect on breast cancer and clotting.  Will check FSH and estradiol.   She may be able to have the IUD removed if she is not interested is using it for HRT. Follow up annually and prn.   After visit summary provided.

## 2021-04-28 ENCOUNTER — Other Ambulatory Visit (HOSPITAL_COMMUNITY): Payer: Self-pay

## 2021-04-28 ENCOUNTER — Encounter: Payer: Self-pay | Admitting: Obstetrics and Gynecology

## 2021-04-28 ENCOUNTER — Ambulatory Visit (INDEPENDENT_AMBULATORY_CARE_PROVIDER_SITE_OTHER): Payer: BC Managed Care – PPO | Admitting: Obstetrics and Gynecology

## 2021-04-28 ENCOUNTER — Other Ambulatory Visit: Payer: Self-pay

## 2021-04-28 VITALS — BP 114/66 | HR 76 | Ht 62.0 in | Wt 125.0 lb

## 2021-04-28 DIAGNOSIS — Z01419 Encounter for gynecological examination (general) (routine) without abnormal findings: Secondary | ICD-10-CM

## 2021-04-28 DIAGNOSIS — N951 Menopausal and female climacteric states: Secondary | ICD-10-CM | POA: Diagnosis not present

## 2021-04-28 LAB — FOLLICLE STIMULATING HORMONE: FSH: 111 m[IU]/mL

## 2021-04-28 LAB — ESTRADIOL: Estradiol: <15 pg/mL

## 2021-04-28 MED ORDER — ESTRADIOL 2 MG VA RING
2.0000 mg | VAGINAL_RING | VAGINAL | 3 refills | Status: DC
  Filled 2021-04-28: qty 1, 90d supply, fill #0
  Filled 2021-07-16: qty 1, 90d supply, fill #1
  Filled 2021-10-20: qty 1, 90d supply, fill #2

## 2021-04-28 NOTE — Patient Instructions (Addendum)

## 2021-04-29 ENCOUNTER — Other Ambulatory Visit (HOSPITAL_COMMUNITY): Payer: Self-pay

## 2021-04-29 ENCOUNTER — Other Ambulatory Visit: Payer: Self-pay | Admitting: *Deleted

## 2021-04-29 DIAGNOSIS — Z30432 Encounter for removal of intrauterine contraceptive device: Secondary | ICD-10-CM

## 2021-07-09 ENCOUNTER — Encounter: Payer: Self-pay | Admitting: Internal Medicine

## 2021-07-12 ENCOUNTER — Other Ambulatory Visit: Payer: Self-pay

## 2021-07-12 ENCOUNTER — Encounter: Payer: Self-pay | Admitting: Obstetrics and Gynecology

## 2021-07-12 ENCOUNTER — Ambulatory Visit (INDEPENDENT_AMBULATORY_CARE_PROVIDER_SITE_OTHER): Payer: BC Managed Care – PPO | Admitting: Obstetrics and Gynecology

## 2021-07-12 DIAGNOSIS — Z30432 Encounter for removal of intrauterine contraceptive device: Secondary | ICD-10-CM

## 2021-07-12 NOTE — Progress Notes (Signed)
GYNECOLOGY  VISIT ?  ?HPI: ?51 y.o.   Married  Caucasian  female   ?G0P0000 with No LMP recorded. (Menstrual status: IUD).   ?here for Mirena IUD removal.  ? ?Patient is having menopausal atrophy symptoms.  ?She was prescribed an Estring.  ?This is helping with dryness with intercourse.  ? ?[place] 111 and estradiol < 15 on 04/28/21.  ? ?Mother had osteoporosis and father had osteopenia.  ?She is asking about bone density testing.  ? ?GYNECOLOGIC HISTORY: ?No LMP recorded. (Menstrual status: IUD). ?Contraception:  Mirena IUD 06/16/16 ?Menopausal hormone therapy:  n/a ?Last mammogram:  02-04-21 Neg/Birads1 ?Last pap smear: 12-14-17 Neg:Neg HR HPV, 10-08-15 Neg, 05-06-13 Neg:Neg HR HPV ?       ?OB History   ? ? Gravida  ?0  ? Para  ?0  ? Term  ?0  ? Preterm  ?0  ? AB  ?0  ? Living  ?0  ?  ? ? SAB  ?0  ? IAB  ?0  ? Ectopic  ?0  ? Multiple  ?0  ? Live Births  ?   ?   ?  ?  ?    ? ?Patient Active Problem List  ? Diagnosis Date Noted  ? Carpal tunnel syndrome, bilateral 10/12/2020  ? ADHD (attention deficit hyperactivity disorder), combined type 08/14/2014  ? Vitamin D deficiency 08/16/2012  ? ? ?Past Medical History:  ?Diagnosis Date  ? Abnormal Pap smear of cervix   ? just a repeat a long time ago  ? Allergy   ? no testing   ? Depression   ? Hypertension   ? STD (sexually transmitted disease)   ? chlamydia age 23  ? Urinary incontinence   ? ? ?Past Surgical History:  ?Procedure Laterality Date  ? APPENDECTOMY    ? BREAST EXCISIONAL BIOPSY Right 2000  ? DILATION AND CURETTAGE OF UTERUS    ? age 41  ? INTRAUTERINE DEVICE INSERTION    ? mirena removed & re-inserted 06-16-16  ? TONSILLECTOMY AND ADENOIDECTOMY    ? TYMPANOSTOMY TUBE PLACEMENT    ? ? ?Current Outpatient Medications  ?Medication Sig Dispense Refill  ? Acetylcysteine (NAC PO) Take by mouth.    ? b complex vitamins tablet Take 1 tablet by mouth daily.    ? Calcium 200 MG TABS Take by mouth daily.    ? Cholecalciferol (VITAMIN D3 PO) Take by mouth.    ? estradiol (ESTRING) 2  MG vaginal ring Place 1 ring vaginally every 3 (three) months as directed. 1 each 3  ? Ginkgo 60 MG TABS     ? IRON PO Take by mouth daily.    ? levonorgestrel (MIRENA) 20 MCG/24HR IUD 1 each by Intrauterine route once.    ? Multiple Vitamin (MULTIVITAMIN) tablet Take 1 tablet by mouth daily.    ? Probiotic Product (PROBIOTIC ACIDOPHILUS BEADS PO) Take by mouth daily.    ? QUERCETIN PO Take by mouth.    ? vitamin E 400 UNIT/15ML LIQD     ? ?No current facility-administered medications for this visit.  ?  ? ?ALLERGIES: Patient has no known allergies. ? ?Family History  ?Problem Relation Age of Onset  ? Hypertension Mother   ? Osteoporosis Mother   ? Alzheimer's disease Mother   ? Cancer Sister   ?     ? unsure if she does  ? Hypertension Sister   ? Osteopenia Father   ? Heart attack Maternal Grandfather   ? Osteoporosis Maternal  Aunt   ? Osteoporosis Maternal Grandmother   ? Osteoporosis Paternal Grandmother   ? Heart disease Paternal Grandfather   ? Breast cancer Neg Hx   ? Colon cancer Neg Hx   ? Colon polyps Neg Hx   ? Esophageal cancer Neg Hx   ? Rectal cancer Neg Hx   ? Stomach cancer Neg Hx   ? ? ?Social History  ? ?Socioeconomic History  ? Marital status: Married  ?  Spouse name: Not on file  ? Number of children: Not on file  ? Years of education: Not on file  ? Highest education level: Not on file  ?Occupational History  ? Not on file  ?Tobacco Use  ? Smoking status: Never  ? Smokeless tobacco: Never  ?Vaping Use  ? Vaping Use: Never used  ?Substance and Sexual Activity  ? Alcohol use: Yes  ?  Alcohol/week: 3.0 standard drinks  ?  Types: 3 Glasses of wine per week  ? Drug use: No  ? Sexual activity: Yes  ?  Partners: Male  ?  Birth control/protection: I.U.D.  ?  Comment: married to [place] IUD inserted 06-16-16  ?Other Topics Concern  ? Not on file  ?Social History Narrative  ? 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 No children  ? ?Social  Determinants of Health  ? ?Financial Resource Strain: Not on file  ?Food Insecurity: Not on file  ?Transportation Needs: Not on file  ?Physical Activity: Not on file  ?Stress: Not on file  ?Social Connections: Not on file  ?Intimate Partner Violence: Not on file  ? ? ?Review of Systems  ?All other systems reviewed and are negative. ? ?PHYSICAL EXAMINATION:   ? ?BP 118/70   Pulse 89   Ht '5\' 2"'$  (1.575 m)   Wt 125 lb (56.7 kg)   SpO2 98%   BMI 22.86 kg/m?     ?General appearance: alert, cooperative and appears stated age ?  ?Pelvic: External genitalia:  no lesions ?             Urethra:  normal appearing urethra with no masses, tenderness or lesions ?             Bartholins and Skenes: normal    ?             Vagina: normal appearing vagina with normal color and discharge, no lesions ?             Cervix: no lesions.  IUD strings noted.  ?               ?Bimanual Exam:  Uterus:  normal size, contour, position, consistency, mobility, non-tender ?             Adnexa: no mass, fullness, tenderness ?  ?IUD removal ?Consent for procedure.  ?Estring removed. ?Sterile prep to cervix with Hibiclens.  ?Ring forceps used to remove IUD, shown to patient and discarded.  ?No complications.  ?Minimal EBL. ?Estring replaced.  ?Chaperone was present for exam:  Kimalexis, CMA.  ? ?ASSESSMENT ? ?Encounter for IUD removal.  ?Vaginal atrophy.  Doing well with Estring.  ?FH osteoporosis.  ? ?PLAN ? ?Continue Estring use.  ?We talked about considering bone density at age 27.  Patient will ask if her family had early onset osteoporosis.   ?Call for any future vaginal bleeding.  ?FU for annual exam and prn.  ? ?An After Visit Summary  was printed and given to the patient. ? ?  ? ?

## 2021-07-19 ENCOUNTER — Other Ambulatory Visit (HOSPITAL_COMMUNITY): Payer: Self-pay

## 2021-08-26 DIAGNOSIS — F411 Generalized anxiety disorder: Secondary | ICD-10-CM | POA: Diagnosis not present

## 2021-09-26 DIAGNOSIS — W19XXXA Unspecified fall, initial encounter: Secondary | ICD-10-CM | POA: Diagnosis not present

## 2021-09-26 DIAGNOSIS — S62642A Nondisplaced fracture of proximal phalanx of right middle finger, initial encounter for closed fracture: Secondary | ICD-10-CM | POA: Diagnosis not present

## 2021-09-30 DIAGNOSIS — S63692A Other sprain of right middle finger, initial encounter: Secondary | ICD-10-CM | POA: Diagnosis not present

## 2021-09-30 DIAGNOSIS — S63694A Other sprain of right ring finger, initial encounter: Secondary | ICD-10-CM | POA: Diagnosis not present

## 2021-10-13 ENCOUNTER — Encounter: Payer: BC Managed Care – PPO | Admitting: Internal Medicine

## 2021-10-24 ENCOUNTER — Other Ambulatory Visit (HOSPITAL_COMMUNITY): Payer: Self-pay

## 2021-11-13 ENCOUNTER — Encounter: Payer: Self-pay | Admitting: Internal Medicine

## 2021-11-13 NOTE — Patient Instructions (Addendum)
Blood work was ordered.     Medications changes include :      Your prescription(s) have been sent to your pharmacy.    A referral was ordered for XX.     Someone from that office will call you to schedule an appointment.    Return in about 1 year (around 11/17/2022) for Physical Exam.    Health Maintenance, Female Adopting a healthy lifestyle and getting preventive care are important in promoting health and wellness. Ask your health care provider about: The right schedule for you to have regular tests and exams. Things you can do on your own to prevent diseases and keep yourself healthy. What should I know about diet, weight, and exercise? Eat a healthy diet  Eat a diet that includes plenty of vegetables, fruits, low-fat dairy products, and lean protein. Do not eat a lot of foods that are high in solid fats, added sugars, or sodium. Maintain a healthy weight Body mass index (BMI) is used to identify weight problems. It estimates body fat based on height and weight. Your health care provider can help determine your BMI and help you achieve or maintain a healthy weight. Get regular exercise Get regular exercise. This is one of the most important things you can do for your health. Most adults should: Exercise for at least 150 minutes each week. The exercise should increase your heart rate and make you sweat (moderate-intensity exercise). Do strengthening exercises at least twice a week. This is in addition to the moderate-intensity exercise. Spend less time sitting. Even light physical activity can be beneficial. Watch cholesterol and blood lipids Have your blood tested for lipids and cholesterol at 52 years of age, then have this test every 5 years. Have your cholesterol levels checked more often if: Your lipid or cholesterol levels are high. You are older than 52 years of age. You are at high risk for heart disease. What should I know about cancer screening? Depending on  your health history and family history, you may need to have cancer screening at various ages. This may include screening for: Breast cancer. Cervical cancer. Colorectal cancer. Skin cancer. Lung cancer. What should I know about heart disease, diabetes, and high blood pressure? Blood pressure and heart disease High blood pressure causes heart disease and increases the risk of stroke. This is more likely to develop in people who have high blood pressure readings or are overweight. Have your blood pressure checked: Every 3-5 years if you are 61-39 years of age. Every year if you are 20 years old or older. Diabetes Have regular diabetes screenings. This checks your fasting blood sugar level. Have the screening done: Once every three years after age 37 if you are at a normal weight and have a low risk for diabetes. More often and at a younger age if you are overweight or have a high risk for diabetes. What should I know about preventing infection? Hepatitis B If you have a higher risk for hepatitis B, you should be screened for this virus. Talk with your health care provider to find out if you are at risk for hepatitis B infection. Hepatitis C Testing is recommended for: Everyone born from [birth year] through 1966. Anyone with known risk factors for hepatitis C. Sexually transmitted infections (STIs) Get screened for STIs, including gonorrhea and chlamydia, if: You are sexually active and are younger than 52 years of age. You are older than 52 years of age and your health care provider tells  you that you are at risk for this type of infection. Your sexual activity has changed since you were last screened, and you are at increased risk for chlamydia or gonorrhea. Ask your health care provider if you are at risk. Ask your health care provider about whether you are at high risk for HIV. Your health care provider may recommend a prescription medicine to help prevent HIV infection. If you choose to take  medicine to prevent HIV, you should first get tested for HIV. You should then be tested every 3 months for as long as you are taking the medicine. Pregnancy If you are about to stop having your period (premenopausal) and you may become pregnant, seek counseling before you get pregnant. Take 400 to 800 micrograms (mcg) of folic acid every day if you become pregnant. Ask for birth control (contraception) if you want to prevent pregnancy. Osteoporosis and menopause Osteoporosis is a disease in which the bones lose minerals and strength with aging. This can result in bone fractures. If you are 58 years old or older, or if you are at risk for osteoporosis and fractures, ask your health care provider if you should: Be screened for bone loss. Take a calcium or vitamin D supplement to lower your risk of fractures. Be given hormone replacement therapy (HRT) to treat symptoms of menopause. Follow these instructions at home: Alcohol use Do not drink alcohol if: Your health care provider tells you not to drink. You are pregnant, may be pregnant, or are planning to become pregnant. If you drink alcohol: Limit how much you have to: 0-1 drink a day. Know how much alcohol is in your drink. In the [place] one drink equals one 12 oz bottle of beer (355 mL), one 5 oz glass of wine (148 mL), or one 1 oz glass of hard liquor (44 mL). Lifestyle Do not use any products that contain nicotine or tobacco. These products include cigarettes, chewing tobacco, and vaping devices, such as e-cigarettes. If you need help quitting, ask your health care provider. Do not use street drugs. Do not share needles. Ask your health care provider for help if you need support or information about quitting drugs. General instructions Schedule regular health, dental, and eye exams. Stay current with your vaccines. Tell your health care provider if: You often feel depressed. You have ever been abused or do not feel safe at  home. Summary Adopting a healthy lifestyle and getting preventive care are important in promoting health and wellness. Follow your health care provider's instructions about healthy diet, exercising, and getting tested or screened for diseases. Follow your health care provider's instructions on monitoring your cholesterol and blood pressure. This information is not intended to replace advice given to you by your health care provider. Make sure you discuss any questions you have with your health care provider. Document Revised: 08/23/2020 Document Reviewed: 08/23/2020 Elsevier Patient Education  [place].

## 2021-11-13 NOTE — Progress Notes (Unsigned)
Subjective:    Patient ID: Sara Potter, female    DOB: 04-15-1971, 51 y.o.   MRN: [id number]      HPI Sk[facility]igh is here for a Physical exam.   She is menopausal - has some hot flashes but it is tolerable.   Overall doing well.    Numbness and pain left foot distal foot between 4-5 th toes.     Medications and allergies reviewed with patient and updated if appropriate.  Current Outpatient Medications on File Prior to Visit  Medication Sig Dispense Refill   Acetylcysteine (NAC PO) Take by mouth.     b complex vitamins tablet Take 1 tablet by mouth daily.     Calcium 200 MG TABS Take by mouth daily.     Cholecalciferol (VITAMIN D3 PO) Take by mouth.     estradiol (ESTRING) 2 MG vaginal ring Place 1 ring vaginally every 3 (three) months as directed. 1 each 3   Ginkgo 60 MG TABS      IRON PO Take by mouth daily.     Multiple Vitamin (MULTIVITAMIN) tablet Take 1 tablet by mouth daily.     Probiotic Product (PROBIOTIC ACIDOPHILUS BEADS PO) Take by mouth daily.     QUERCETIN PO Take by mouth.     vitamin E 400 UNIT/15ML LIQD      No current facility-administered medications on file prior to visit.    Review of Systems  Constitutional:  Negative for fever.  Eyes:  Negative for visual disturbance.  Respiratory:  Negative for cough, shortness of breath and wheezing.   Cardiovascular:  Negative for chest pain, palpitations and leg swelling.  Gastrointestinal:  Negative for abdominal pain, blood in stool, constipation, diarrhea and nausea.       No gerd  Genitourinary:  Negative for dysuria.  Musculoskeletal:  Negative for arthralgias and back pain.  Skin:  Negative for rash.  Neurological:  Positive for headaches (occ - stress related). Negative for light-headedness.  Psychiatric/Behavioral:  Negative for dysphoric mood. The patient is not nervous/anxious.        Objective:   Vitals:   11/16/21 0802  BP: 106/80  Pulse: 68  Temp: 98 F (36.7 C)  SpO2: 98%    Filed Weights   11/16/21 0802  Weight: 124 lb (56.2 kg)   Body mass index is 22.68 kg/m.  BP Readings from Last 3 Encounters:  11/16/21 106/80  07/12/21 118/70  04/28/21 114/66    Wt Readings from Last 3 Encounters:  11/16/21 124 lb (56.2 kg)  07/12/21 125 lb (56.7 kg)  04/28/21 125 lb (56.7 kg)       Physical Exam Constitutional: She appears well-developed and well-nourished. No distress.  HENT:  Head: Normocephalic and atraumatic.  Right Ear: External ear normal. Normal ear canal and TM Left Ear: External ear normal.  Normal ear canal and TM Mouth/Throat: Oropharynx is clear and moist.  Eyes: Conjunctivae normal.  Neck: Neck supple. No tracheal deviation present. No thyromegaly present.  No carotid bruit  Cardiovascular: Normal rate, regular rhythm and normal heart sounds.   No murmur heard.  No edema. Pulmonary/Chest: Effort normal and breath sounds normal. No respiratory distress. She has no wheezes. She has no rales.  Breast: deferred   Abdominal: Soft. She exhibits no distension. There is no tenderness.  Lymphadenopathy: She has no cervical adenopathy.  Skin: Skin is warm and dry. She is not diaphoretic.  Psychiatric: She has a normal mood and affect. Her behavior is normal.  Lab Results  Component Value Date   WBC 4.2 10/12/2020   HGB 13.5 10/12/2020   HCT 40.2 10/12/2020   PLT 265.0 10/12/2020   GLUCOSE 89 10/12/2020   CHOL 196 10/12/2020   TRIG 62.0 10/12/2020   HDL 98.60 10/12/2020   LDLCALC 85 10/12/2020   ALT 15 10/12/2020   AST 16 10/12/2020   NA 140 10/12/2020   K 4.4 10/12/2020   CL 105 10/12/2020   CREATININE 0.74 10/12/2020   BUN 9 10/12/2020   CO2 28 10/12/2020   TSH 2.03 10/12/2020         Assessment & Plan:   Physical exam: Screening blood work  ordered Exercise regular -- yoga, weights, rowing machine, occ running, biking,step, jump Weight  regular Substance abuse  none   Reviewed recommended  immunizations.   Health Maintenance  Topic Date Due   COVID-19 Vaccine (4 - Pfizer series) 12/02/2021 (Originally 03/21/2020)   INFLUENZA VACCINE  07/16/2022 (Originally 11/15/2021)   MAMMOGRAM  02/04/2022   PAP SMEAR-Modifier  12/15/2022   COLONOSCOPY (Pts 45-29yr Insurance coverage will need to be confirmed)  06/19/2023   TETANUS/TDAP  09/24/2024   Hepatitis C Screening  Completed   HIV Screening  Completed   HPV VACCINES  Aged Out   Zoster Vaccines- Shingrix  Discontinued          See Problem List for Assessment and Plan of chronic medical problems.

## 2021-11-16 ENCOUNTER — Encounter: Payer: Self-pay | Admitting: Internal Medicine

## 2021-11-16 ENCOUNTER — Ambulatory Visit (INDEPENDENT_AMBULATORY_CARE_PROVIDER_SITE_OTHER): Payer: BC Managed Care – PPO | Admitting: Internal Medicine

## 2021-11-16 VITALS — BP 106/80 | HR 68 | Temp 98.0°F | Ht 62.0 in | Wt 124.0 lb

## 2021-11-16 DIAGNOSIS — Z0000 Encounter for general adult medical examination without abnormal findings: Secondary | ICD-10-CM

## 2021-11-16 DIAGNOSIS — E559 Vitamin D deficiency, unspecified: Secondary | ICD-10-CM | POA: Diagnosis not present

## 2021-11-16 LAB — CBC WITH DIFFERENTIAL/PLATELET
Basophils Absolute: 0.1 10*3/uL (ref 0.0–0.1)
Basophils Relative: 3.1 % — ABNORMAL HIGH (ref 0.0–3.0)
Eosinophils Absolute: 0.1 10*3/uL (ref 0.0–0.7)
Eosinophils Relative: 1.8 % (ref 0.0–5.0)
HCT: 37.8 % (ref 36.0–46.0)
Hemoglobin: 12.8 g/dL (ref 12.0–15.0)
Lymphocytes Relative: 35.5 % (ref 12.0–46.0)
Lymphs Abs: 1.2 10*3/uL (ref 0.7–4.0)
MCHC: 33.7 g/dL (ref 30.0–36.0)
MCV: 91.8 fl (ref 78.0–100.0)
Monocytes Absolute: 0.3 10*3/uL (ref 0.1–1.0)
Monocytes Relative: 8.2 % (ref 3.0–12.0)
Neutro Abs: 1.8 10*3/uL (ref 1.4–7.7)
Neutrophils Relative %: 51.4 % (ref 43.0–77.0)
Platelets: 267 10*3/uL (ref 150.0–400.0)
RBC: 4.12 Mil/uL (ref 3.87–5.11)
RDW: 12.7 % (ref 11.5–15.5)
WBC: 3.4 10*3/uL — ABNORMAL LOW (ref 4.0–10.5)

## 2021-11-16 LAB — LIPID PANEL
Cholesterol: 182 mg/dL (ref 0–200)
HDL: 88.3 mg/dL (ref >39.00)
LDL Cholesterol: 83 mg/dL (ref 0–99)
NonHDL: 93.42
Total CHOL/HDL Ratio: 2
Triglycerides: 54 mg/dL (ref 0.0–149.0)
VLDL: 10.8 mg/dL (ref 0.0–40.0)

## 2021-11-16 LAB — COMPREHENSIVE METABOLIC PANEL
ALT: 12 U/L (ref 0–35)
AST: 17 U/L (ref 0–37)
Albumin: 4.4 g/dL (ref 3.5–5.2)
Alkaline Phosphatase: 57 U/L (ref 39–117)
BUN: 7 mg/dL (ref 6–23)
CO2: 28 mEq/L (ref 19–32)
Calcium: 9.3 mg/dL (ref 8.4–10.5)
Chloride: 104 mEq/L (ref 96–112)
Creatinine, Ser: 0.75 mg/dL (ref 0.40–1.20)
GFR: 92 mL/min (ref >60.00)
Glucose, Bld: 87 mg/dL (ref 70–99)
Potassium: 4 mEq/L (ref 3.5–5.1)
Sodium: 139 mEq/L (ref 135–145)
Total Bilirubin: 0.4 mg/dL (ref 0.2–1.2)
Total Protein: 6.7 g/dL (ref 6.0–8.3)

## 2021-11-16 LAB — VITAMIN D 25 HYDROXY (VIT D DEFICIENCY, FRACTURES): VITD: 49.74 ng/mL (ref 30.00–100.00)

## 2021-11-16 LAB — TSH: TSH: 2.23 u[IU]/mL (ref 0.35–5.50)

## 2021-11-16 NOTE — Assessment & Plan Note (Signed)
Chronic Taking vitamin D daily Check vitamin D level  

## 2021-11-25 ENCOUNTER — Ambulatory Visit (INDEPENDENT_AMBULATORY_CARE_PROVIDER_SITE_OTHER): Payer: BC Managed Care – PPO

## 2021-11-25 ENCOUNTER — Ambulatory Visit: Payer: BC Managed Care – PPO | Admitting: Podiatrist

## 2021-11-25 ENCOUNTER — Encounter: Payer: Self-pay | Admitting: Podiatrist

## 2021-11-25 DIAGNOSIS — D3613 Benign neoplasm of peripheral nerves and autonomic nervous system of lower limb, including hip: Secondary | ICD-10-CM | POA: Diagnosis not present

## 2021-11-25 DIAGNOSIS — G5782 Other specified mononeuropathies of left lower limb: Secondary | ICD-10-CM | POA: Diagnosis not present

## 2021-11-25 NOTE — Patient Instructions (Signed)
Morton Neuralgia  Morton neuralgia is foot pain that affects the ball of the foot and the area near the toes. Morton neuralgia occurs when part of a nerve in the foot (digital nerve) is under too much pressure (compressed). When this happens over a long period of time, the nerve can thicken (neuroma) and cause pain. Pain usually occurs between the third and fourth toes.  Morton neuralgia can come and go but may get worse over time. What are the causes? This condition is caused by doing the same things over and over with your foot, such as: Activities such as running or jumping. Wearing shoes that are too tight. What increases the risk? You may be at higher risk for Morton neuralgia if you: Are female. Wear high heels. Wear shoes that are narrow or tight. Do activities that repeatedly stretch your toes, such as: Running. Ballet. Long-distance walking. What are the signs or symptoms? The first symptom of Morton neuralgia is pain that spreads from the ball of the foot to the toes. It may feel like you are walking on a marble. Pain usually gets worse with walking and goes away at night. Other symptoms may include numbness and cramping of your toes. Both feet are equally affected, but rarely at the same time. How is this diagnosed? This condition is diagnosed based on your symptoms, your medical history, and a physical exam. Your health care provider may: Squeeze your foot just behind your toe. Ask you to move your toes to check for pain. Ask about your physical activity level. You also may have imaging tests, such as an X-ray, ultrasound, or MRI. How is this treated? Treatment depends on how severe your condition is and what causes it. Treatment may involve: Wearing different shoes that are not too tight, are low-heeled, and provide good support. For some people, this is the only treatment needed. Wearing an over-the-counter or custom supportive pad (orthotic) under the front of your  foot. Getting injections of numbing medicine and anti-inflammatory medicine (steroid) in the nerve. Having surgery to remove part of the thickened nerve. Follow these instructions at home: Managing pain, stiffness, and swelling  Massage your foot as needed. Wear orthotics as told by your health care provider. If directed, put ice on the painful area. To do this: Put ice in a plastic bag. Place a towel between your skin and the bag. Leave the ice on for 20 minutes, 2-3 times a day. Remove the ice if your skin turns bright red. This is very important. If you cannot feel pain, heat, or cold, you have a greater risk of damage to the area. Raise (elevate) the injured area above the level of your heart while you are sitting or lying down. Avoid activities that cause pain or make pain worse. If you play sports, ask your health care provider when it is safe for you to return to sports. General instructions Take over-the-counter and prescription medicines only as told by your health care provider. For the time period you were told by your health care provider, do not drive or use machinery. Wear shoes that: Have soft soles. Have a wide toe area. Provide arch support. Do not pinch or squeeze your feet. Have room for your orthotics, if this applies. Keep all follow-up visits. This is important. Contact a health care provider if: Your symptoms get worse or do not get better with treatment and home care. Summary Morton neuralgia is foot pain that affects the ball of the foot and the   area near the toes. Pain usually occurs between the third and fourth toes, gets worse with walking, and goes away at night. Morton neuralgia occurs when part of a nerve in the foot (digital nerve) is under too much pressure. When this happens over a long period of time, the nerve can thicken (neuroma) and cause pain. This condition is caused by doing the same things over and over with your foot, such as running or  jumping, wearing shoes that are too tight, or wearing high heels. Treatment may involve wearing low-heeled shoes that are not too tight, wearing a supportive pad (orthotic) under the front of your foot, getting injections in the nerve, or having surgery to remove part of the thickened nerve. This information is not intended to replace advice given to you by your health care provider. Make sure you discuss any questions you have with your health care provider. Document Revised: 09/10/2020 Document Reviewed: 09/10/2020 Elsevier Patient Education  2023 [employer]  

## 2021-11-25 NOTE — Progress Notes (Signed)
  Chief Complaint  Patient presents with   Foot Pain    Dorsal forefoot and 4th/5th left - aching, numbness x 1-2 months, PCP thought had a pinched nerve   New Patient (Initial Visit)     HPI: Patient is 51 y.o. female who presents today for a numb feeling between her metatarsal  4 and 5 of the left foot.  She saw her primary care provider who suggested it could be a pinched nerve.  She is here for further evaluation.   Patient Active Problem List   Diagnosis Date Noted   Carpal tunnel syndrome, bilateral 10/12/2020   ADHD (attention deficit hyperactivity disorder), combined type 08/14/2014   Vitamin D deficiency 08/16/2012    Current Outpatient Medications on File Prior to Visit  Medication Sig Dispense Refill   Acetylcysteine (NAC PO) Take by mouth.     b complex vitamins tablet Take 1 tablet by mouth daily.     Calcium 200 MG TABS Take by mouth daily.     Cholecalciferol (VITAMIN D3 PO) Take by mouth.     estradiol (ESTRING) 2 MG vaginal ring Place 1 ring vaginally every 3 (three) months as directed. 1 each 3   Ginkgo 60 MG TABS      IRON PO Take by mouth daily.     Multiple Vitamin (MULTIVITAMIN) tablet Take 1 tablet by mouth daily.     Probiotic Product (PROBIOTIC ACIDOPHILUS BEADS PO) Take by mouth daily.     QUERCETIN PO Take by mouth.     vitamin E 400 UNIT/15ML LIQD      No current facility-administered medications on file prior to visit.    No Known Allergies  Review of Systems No fevers, chills, nausea, muscle aches, no difficulty breathing, no calf pain, no chest pain or shortness of breath.   Physical Exam  GENERAL APPEARANCE: Alert, conversant. Appropriately groomed. No acute distress.   VASCULAR: Pedal pulses palpable 2/4 DP and 2/4 PT bilateral.  Capillary refill time is immediate to all digits,  Proximal to distal cooling it warm to warm.  Digital perfusion adequate.   NEUROLOGIC: sensation is intact to 5.07 monofilament at 5/5 sites bilateral.  Light  touch is intact bilateral, vibratory sensation intact bilateral.  Palpable neuroma is noted the fourth interspace of the left foot.  Numbness is reported in this area as well.  MUSCULOSKELETAL: acceptable muscle strength, tone and stability bilateral.  No gross boney pedal deformities noted.  No pain, crepitus or limitation noted with foot and ankle range of motion bilateral.   DERMATOLOGIC: skin is warm, supple, and dry.  Color, texture, and turgor of skin within normal limits.  No open wounds are noted.  No preulcerative lesions are seen.  Digital nails are asymptomatic.    X-ray evaluation 3 views of the left foot is obtained.  No acute osseous abnormalities are noted.  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  Agreed with her primary care physician that this is likely a pinched nerve/neuroma.  At this time the area is not painful and therefore I recommended wearing wide supportive shoe gear.  Should the area become painful we discussed injection therapy in the future should it be needed.  She will call in the future should further treatment be requested.

## 2021-12-22 ENCOUNTER — Other Ambulatory Visit: Payer: Self-pay | Admitting: Internal Medicine

## 2021-12-22 DIAGNOSIS — Z1231 Encounter for screening mammogram for malignant neoplasm of breast: Secondary | ICD-10-CM

## 2021-12-23 DIAGNOSIS — F902 Attention-deficit hyperactivity disorder, combined type: Secondary | ICD-10-CM | POA: Diagnosis not present

## 2022-01-16 ENCOUNTER — Other Ambulatory Visit (HOSPITAL_COMMUNITY): Payer: Self-pay

## 2022-01-16 MED ORDER — ESTRING 2 MG VA RING
VAGINAL_RING | VAGINAL | 0 refills | Status: DC
  Filled 2022-01-16 – 2022-01-21 (×2): qty 1, 90d supply, fill #0

## 2022-01-17 ENCOUNTER — Other Ambulatory Visit (HOSPITAL_COMMUNITY): Payer: Self-pay

## 2022-01-21 ENCOUNTER — Other Ambulatory Visit (HOSPITAL_COMMUNITY): Payer: Self-pay

## 2022-01-26 ENCOUNTER — Other Ambulatory Visit: Payer: Self-pay | Admitting: Obstetrics and Gynecology

## 2022-01-26 ENCOUNTER — Other Ambulatory Visit (HOSPITAL_COMMUNITY): Payer: Self-pay

## 2022-01-26 ENCOUNTER — Encounter: Payer: Self-pay | Admitting: Obstetrics and Gynecology

## 2022-01-26 MED ORDER — ESTRING 2 MG VA RING
VAGINAL_RING | VAGINAL | 0 refills | Status: DC
Start: 2022-01-26 — End: 2022-05-03
  Filled 2022-01-26 – 2022-04-13 (×2): qty 1, 90d supply, fill #0

## 2022-01-27 DIAGNOSIS — F902 Attention-deficit hyperactivity disorder, combined type: Secondary | ICD-10-CM | POA: Diagnosis not present

## 2022-02-10 ENCOUNTER — Ambulatory Visit
Admission: RE | Admit: 2022-02-10 | Discharge: 2022-02-10 | Disposition: A | Discharge status: Home / Self Care | Payer: BC Managed Care – PPO | Source: Ambulatory Visit | Attending: Internal Medicine | Admitting: Internal Medicine

## 2022-02-10 DIAGNOSIS — Z1231 Encounter for screening mammogram for malignant neoplasm of breast: Secondary | ICD-10-CM

## 2022-02-17 DIAGNOSIS — F902 Attention-deficit hyperactivity disorder, combined type: Secondary | ICD-10-CM | POA: Diagnosis not present

## 2022-03-24 DIAGNOSIS — F902 Attention-deficit hyperactivity disorder, combined type: Secondary | ICD-10-CM | POA: Diagnosis not present

## 2022-04-13 ENCOUNTER — Other Ambulatory Visit (HOSPITAL_COMMUNITY): Payer: Self-pay

## 2022-04-15 ENCOUNTER — Ambulatory Visit
Admission: RE | Admit: 2022-04-15 | Discharge: 2022-04-15 | Disposition: A | Discharge status: Home / Self Care | Payer: BC Managed Care – PPO | Source: Ambulatory Visit | Attending: Emergency Medicine | Admitting: Emergency Medicine

## 2022-04-15 VITALS — BP 126/65 | HR 80 | Temp 97.9°F | Resp 16

## 2022-04-15 DIAGNOSIS — J029 Acute pharyngitis, unspecified: Secondary | ICD-10-CM | POA: Diagnosis not present

## 2022-04-15 DIAGNOSIS — B9789 Other viral agents as the cause of diseases classified elsewhere: Secondary | ICD-10-CM | POA: Insufficient documentation

## 2022-04-15 DIAGNOSIS — J988 Other specified respiratory disorders: Secondary | ICD-10-CM | POA: Diagnosis not present

## 2022-04-15 LAB — POCT RAPID STREP A (OFFICE): Rapid Strep A Screen: NEGATIVE

## 2022-04-15 NOTE — ED Triage Notes (Signed)
Pt c/o cough, sore throat, bilateral ear fulness and runny nose.  Started: Wednesday  Home interventions: aleve, motrin   At home covid test was negative on Thursday.

## 2022-04-15 NOTE — Discharge Instructions (Addendum)
Your rapid strep test today was negative.  Per our protocol, we will confirm this with a throat culture.  This typically takes 2 to 3 days.  If there is a positive throat culture result, you will be contacted by phone and antibiotics will be prescribed for you.    Based on the history provided to me today, your rapid strep test result, physical exam findings, you appear to be suffering from a viral illness.  As you are aware, we do not have any influenza tests at this time due to a national shortage but based on my physical exam findings, I do not believe you are suffering from influenza at this time.  At this time, conservative care is recommended.  You are welcome to continue taking Aleve and ibuprofen, using throat lozenges, treating your allergies as you have been.  If you begin to develop fever or worsening cough productive of thick, abnormally dark-colored sputum, shortness of breath, please seek medical attention.  Thank you for visiting urgent care today.

## 2022-04-15 NOTE — ED Provider Notes (Incomplete)
[facility]    CSN: [id number] Arrival date & time: 04/15/22  1017    HISTORY   Chief Complaint  Patient presents with   Cough    Sore throat - Entered by patient   Ear Fullness   Sore Throat   HPI Sara Potter is a pleasant, 51 y.o. female who presents to urgent care today. Pt c/o cough, sore throat, bilateral ear fulness and runny nose for the past 3 days, states her initial symptom was sore throat, endorses pain with swallowing and states pain radiates to the roof of her mouth.  Patient describes her cough as a late symptom and "slight".  Patient states her rhinorrhea has been clear.  Patient states has been taking Aleve and Motrin which seems to alleviate her symptoms somewhat.  Patient states she also takes quercetin homeopathic treatment for allergies.  Patient states she took a COVID-19 test at home 2 days ago which was negative.  Patient states her husband has had similar symptoms, was tested for RSV, influenza and COVID-19, all results were negative.  The history is provided by the patient.   Past Medical History:  Diagnosis Date   Abnormal Pap smear of cervix    just a repeat a long time ago   Allergy    no testing    Depression    Hypertension    STD (sexually transmitted disease)    chlamydia age 15   Urinary incontinence    Patient Active Problem List   Diagnosis Date Noted   Carpal tunnel syndrome, bilateral 10/12/2020   ADHD (attention deficit hyperactivity disorder), combined type 08/14/2014   Vitamin D deficiency 08/16/2012   Past Surgical History:  Procedure Laterality Date   APPENDECTOMY     BREAST EXCISIONAL BIOPSY Right 2000   DILATION AND CURETTAGE OF UTERUS     age 56   INTRAUTERINE DEVICE INSERTION     mirena removed & re-inserted 06-16-16   TONSILLECTOMY AND ADENOIDECTOMY     TYMPANOSTOMY TUBE PLACEMENT     OB History     Gravida  0   Para  0   Term  0   Preterm  0   AB  0   Living  0      SAB  0   IAB  0    Ectopic  0   Multiple  0   Live Births             Home Medications    Prior to Admission medications   Medication Sig Start Date End Date Taking? Authorizing Provider  Acetylcysteine (NAC PO) Take by mouth.    [provider]  b complex vitamins tablet Take 1 tablet by mouth daily.    [provider]  Calcium 200 MG TABS Take by mouth daily.    [provider]  Cholecalciferol (VITAMIN D3 PO) Take by mouth.    [provider]  estradiol (ESTRING) 7.5 MCG/24HR vaginal ring Place vaginally every 3 months as directed. 01/26/22   Nunzio Cobbs, MD  Ginkgo 60 MG TABS  12/17/18   [provider]  IRON PO Take by mouth daily.    [provider]  Multiple Vitamin (MULTIVITAMIN) tablet Take 1 tablet by mouth daily.    [provider]  Probiotic Product (PROBIOTIC ACIDOPHILUS BEADS PO) Take by mouth daily.    [provider]  QUERCETIN PO Take by mouth.    [provider]  vitamin E 400  UNIT/15ML LIQD     [provider]    Family History Family History  Problem Relation Age of Onset   Hypertension Mother    Osteoporosis Mother    Alzheimer's disease Mother    Cancer Sister        ? unsure if she does   Hypertension Sister    Osteopenia Father    Heart attack Maternal Grandfather    Osteoporosis Maternal Aunt    Osteoporosis Maternal Grandmother    Osteoporosis Paternal Grandmother    Heart disease Paternal Grandfather    Breast cancer Neg Hx    Colon cancer Neg Hx    Colon polyps Neg Hx    Esophageal cancer Neg Hx    Rectal cancer Neg Hx    Stomach cancer Neg Hx    Social History Social History   Tobacco Use   Smoking status: Never   Smokeless tobacco: Never  Vaping Use   Vaping Use: Never used  Substance Use Topics   Alcohol use: Yes    Alcohol/week: 3.0 standard drinks of alcohol    Types: 3 Glasses of wine per week   Drug use: No   Allergies   Patient has  no known allergies.  Review of Systems Review of Systems Pertinent findings revealed after performing a 14 point review of systems has been noted in the history of present illness.  Physical Exam Triage Vital Signs ED Triage Vitals  Enc Vitals Group     BP 02/11/21 0827 (!) 147/82     Pulse Rate 02/11/21 0827 72     Resp 02/11/21 0827 18     Temp 02/11/21 0827 98.3 F (36.8 C)     Temp Source 02/11/21 0827 Oral     SpO2 02/11/21 0827 98 %     Weight --      Height --      Head Circumference --      Peak Flow --      Pain Score 02/11/21 0826 5     Pain Loc --      Pain Edu? --      Excl. in [place]? --   No data found.  Updated Vital Signs BP 126/65 (BP Location: Right Arm)   Pulse 80   Temp 97.9 F (36.6 C) (Oral)   Resp 16   LMP 02/16/2007   SpO2 98%   Physical Exam Vitals and nursing note reviewed.  Constitutional:      General: She is not in acute distress.    Appearance: Normal appearance. She is not ill-appearing.  HENT:     Head: Normocephalic and atraumatic.     Salivary Glands: Right salivary gland is not diffusely enlarged or tender. Left salivary gland is not diffusely enlarged or tender.     Right Ear: Ear canal and external ear normal. No drainage. A middle ear effusion is present. There is no impacted cerumen. Tympanic membrane is bulging. Tympanic membrane is not injected or erythematous.     Left Ear: Ear canal and external ear normal. No drainage. A middle ear effusion is present. There is no impacted cerumen. Tympanic membrane is bulging. Tympanic membrane is not injected or erythematous.     Ears:     Comments: Bilateral EACs normal, both TMs bulging with clear fluid    Nose: Rhinorrhea present. No nasal deformity, septal deviation, signs of injury, nasal tenderness, mucosal edema or congestion. Rhinorrhea is clear.     Right Nostril: Occlusion present. No foreign body,  epistaxis or septal hematoma.     Left Nostril: Occlusion present. No foreign body,  epistaxis or septal hematoma.     Right Turbinates: Enlarged, swollen and pale.     Left Turbinates: Enlarged, swollen and pale.     Right Sinus: No maxillary sinus tenderness or frontal sinus tenderness.     Left Sinus: No maxillary sinus tenderness or frontal sinus tenderness.     Mouth/Throat:     Lips: Pink. No lesions.     Mouth: Mucous membranes are moist. No oral lesions.     Pharynx: Oropharynx is clear. Uvula midline. Posterior oropharyngeal erythema present. No pharyngeal swelling, oropharyngeal exudate or uvula swelling.     Tonsils: No tonsillar exudate. 0 on the right. 0 on the left.     Comments: Postnasal drip Eyes:     General: Lids are normal.        Right eye: No discharge.        Left eye: No discharge.     Extraocular Movements: Extraocular movements intact.     Conjunctiva/sclera: Conjunctivae normal.     Right eye: Right conjunctiva is not injected.     Left eye: Left conjunctiva is not injected.  Neck:     Trachea: Trachea and phonation normal.  Cardiovascular:     Rate and Rhythm: Normal rate and regular rhythm.     Pulses: Normal pulses.     Heart sounds: Normal heart sounds. No murmur heard.    No friction rub. No gallop.  Pulmonary:     Effort: Pulmonary effort is normal. No accessory muscle usage, prolonged expiration or respiratory distress.     Breath sounds: Normal breath sounds. No stridor, decreased air movement or transmitted upper airway sounds. No decreased breath sounds, wheezing, rhonchi or rales.  Chest:     Chest wall: No tenderness.  Musculoskeletal:        General: Normal range of motion.     Cervical back: Normal range of motion and neck supple. Normal range of motion.  Lymphadenopathy:     Cervical: No cervical adenopathy.  Skin:    General: Skin is warm and dry.     Findings: No erythema or rash.  Neurological:     General: No focal deficit present.     Mental Status: She is alert and oriented to person, place, and time.   Psychiatric:        Mood and Affect: Mood normal.        Behavior: Behavior normal.     Visual Acuity Right Eye Distance:   Left Eye Distance:   Bilateral Distance:    Right Eye Near:   Left Eye Near:    Bilateral Near:     [facility] Couse / Diagnostics / Procedures:     Radiology No results found.  Procedures Procedures (including critical care time) EKG  Pending results:  Labs Reviewed  CULTURE, GROUP A STREP [facility])  POCT RAPID STREP A (OFFICE)    Medications Ordered in UC: Medications - No data to display  UC Diagnoses / Final Clinical Impressions(s)   I have reviewed the triage vital signs and the nursing notes.  Pertinent labs & imaging results that were available during my care of the patient were reviewed by me and considered in my medical decision making (see chart for details).    Final diagnoses:  Acute pharyngitis, unspecified etiology  Viral respiratory infection   *** Please see discharge instructions below for further details of plan of care as  provided to patient. ED Prescriptions   None    PDMP not reviewed this encounter.  Disposition Upon Discharge:  Condition: stable for discharge home Home: take medications as prescribed; routine discharge instructions as discussed; follow up as advised.  Patient presented with an acute illness with associated systemic symptoms and significant discomfort requiring urgent management. In my opinion, this is a condition that a prudent lay person (someone who possesses an average knowledge of health and medicine) may potentially expect to result in complications if not addressed urgently such as respiratory distress, impairment of bodily function or dysfunction of bodily organs.   Routine symptom specific, illness specific and/or disease specific instructions were discussed with the patient and/or caregiver at length.   As such, the patient has been evaluated and assessed, work-up was performed and treatment was  provided in alignment with urgent care protocols and evidence based medicine.  Patient/parent/caregiver has been advised that the patient may require follow up for further testing and treatment if the symptoms continue in spite of treatment, as clinically indicated and appropriate.  If the patient was tested for COVID-19, Influenza and/or RSV, then the patient/parent/guardian was advised to isolate at home pending the results of his/her diagnostic coronavirus test and potentially longer if they're positive. I have also advised pt that if his/her COVID-19 test returns positive, it's recommended to self-isolate for at least 10 days after symptoms first appeared AND until fever-free for 24 hours without fever reducer AND other symptoms have improved or resolved. Discussed self-isolation recommendations as well as instructions for household member/close contacts as per the [facility] and [place] DHHS, and also gave patient the [place] packet with this information.  Patient/parent/caregiver has been advised to return to the [facility] or PCP in 3-5 days if no better; to PCP or the Emergency Department if new signs and symptoms develop, or if the current signs or symptoms continue to change or worsen for further workup, evaluation and treatment as clinically indicated and appropriate  The patient will follow up with their current PCP if and as advised. If the patient does not currently have a PCP we will assist them in obtaining one.   The patient may need specialty follow up if the symptoms continue, in spite of conservative treatment and management, for further workup, evaluation, consultation and treatment as clinically indicated and appropriate.  Patient/parent/caregiver verbalized understanding and agreement of plan as discussed.  All questions were addressed during visit.  Please see discharge instructions below for further details of plan.  Discharge Instructions:   Discharge Instructions      Your rapid strep test  today was negative.  Per our protocol, we will confirm this with a throat culture.  This typically takes 2 to 3 days.  If there is a positive throat culture result, you will be contacted by phone and antibiotics will be prescribed for you.    Based on the history provided to me today, your rapid strep test result, physical exam findings, you appear to be suffering from a viral illness.  As you are aware, we do not have any influenza tests at this time due to a national shortage but based on my physical exam findings, I do not believe you are suffering from influenza at this time.  At this time, conservative care is recommended.  You are welcome to continue taking Aleve and ibuprofen, using throat lozenges, treating your allergies as you have been.  If you begin to develop fever or worsening cough productive of thick, abnormally dark-colored  sputum, shortness of breath, please seek medical attention.  Thank you for visiting urgent care today.      This office note has been dictated using Museum/gallery curator.  Unfortunately, this method of dictation can sometimes lead to typographical or grammatical errors.  I apologize for your inconvenience in advance if this occurs.  Please do not hesitate to reach out to me if clarification is needed.

## 2022-04-17 LAB — CULTURE, GROUP A STREP (THRC)

## 2022-04-20 NOTE — Progress Notes (Signed)
    Subjective:    Patient ID: Sara Potter, female    DOB: Dec 16, 1970, 52 y.o.   MRN: [id number]      HPI Sara Potter is here for  Chief Complaint  Patient presents with   Carpal Tunnel    Continued CTS symptoms -she has bilateral carpal tunnel syndrome-worse in the right hand.  Her symptoms have worsened since she was here last.  She has been using wrist splints consistently.    Initially her symptoms were just bothering her at night.  Then would get up with numb hands nad it would last a while.  Now she has to wear the spint when she drives and wrists, uses computer.  She should be using them when eating but does not.   Her main symptom is numbness.  No weakness, but occasional pain.  Medications and allergies reviewed with patient and updated if appropriate.  Current Outpatient Medications on File Prior to Visit  Medication Sig Dispense Refill   Acetylcysteine (NAC PO) Take by mouth.     b complex vitamins tablet Take 1 tablet by mouth daily.     Calcium 200 MG TABS Take by mouth daily.     Cholecalciferol (VITAMIN D3 PO) Take by mouth.     estradiol (ESTRING) 7.5 MCG/24HR vaginal ring Place vaginally every 3 months as directed. 1 each 0   Ginkgo 60 MG TABS      IRON PO Take by mouth daily.     Multiple Vitamin (MULTIVITAMIN) tablet Take 1 tablet by mouth daily.     Probiotic Product (PROBIOTIC ACIDOPHILUS BEADS PO) Take by mouth daily.     QUERCETIN PO Take by mouth.     vitamin E 400 UNIT/15ML LIQD      No current facility-administered medications on file prior to visit.    Review of Systems     Objective:   Vitals:   04/21/22 1558  BP: 120/76  Pulse: 85  Temp: 98.7 F (37.1 C)  SpO2: 98%   BP Readings from Last 3 Encounters:  04/21/22 120/76  04/15/22 126/65  11/16/21 106/80   Wt Readings from Last 3 Encounters:  04/21/22 127 lb (57.6 kg)  11/16/21 124 lb (56.2 kg)  07/12/21 125 lb (56.7 kg)   Body mass index is 23.23 kg/m.    Physical  Exam Constitutional:      General: She is not in acute distress.    Appearance: Normal appearance. She is not ill-appearing.  Musculoskeletal:     Comments: Normal sensation, strength, range of motion in bilateral wrists, hands without muscle atrophy  Skin:    General: Skin is warm and dry.     Findings: No erythema.  Neurological:     Mental Status: She is alert.            Assessment & Plan:    See Problem List for Assessment and Plan of chronic medical problems.

## 2022-04-20 NOTE — Progress Notes (Signed)
52 y.o. G14P0000 Married Caucasian female here for annual exam.    Wants to discuss bone density.  Mother had osteoporosis at an early age.   Using Estring for vaginal atrophy. Wants to continue.   IUD was removed in 2023.  [place] [facility] on 04/28/21.  PCP:   Dr. Quay Burow  Patient's last menstrual period was 02/16/2007.           Sexually active: Yes.    The current method of family planning is post menopausal status Exercising: Yes.     Yoga, bike, weights, rowing, nordic, running  Smoker:  no  Health Maintenance: Pap:  12-14-17 Neg:Neg HR HPV, 10-08-15 Neg, 05-06-13 Neg:Neg HR HPV  History of abnormal Pap:  no MMG:  02/10/22 Breast Density Category D, BI-RADS CATEGORY 1 Negative  Colonoscopy:  06/18/20 - due in 2025, polyps BMD:   n/a  Result  n/a (pt wants to discuss earlier screening) TDaP:  09/25/14 Gardasil:   no HIV: 09/02/15 NR Hep C: 10/12/20 Neg Screening Labs:  PCP Flu vaccine:  completed. Covid vaccine:  completed. Shingrix:  completed.   reports that she has never smoked. She has never used smokeless tobacco. She reports current alcohol use of about 3.0 standard drinks of alcohol per week. She reports that she does not use drugs.  Past Medical History:  Diagnosis Date   Abnormal Pap smear of cervix    just a repeat a long time ago   Allergy    no testing    Depression    Hypertension    STD (sexually transmitted disease)    chlamydia age 42   Urinary incontinence     Past Surgical History:  Procedure Laterality Date   APPENDECTOMY     BREAST EXCISIONAL BIOPSY Right 2000   DILATION AND CURETTAGE OF UTERUS     age 44   INTRAUTERINE DEVICE INSERTION     mirena removed & re-inserted 06-16-16   TONSILLECTOMY AND ADENOIDECTOMY     TYMPANOSTOMY TUBE PLACEMENT      Current Outpatient Medications  Medication Sig Dispense Refill   Acetylcysteine (NAC PO) Take by mouth.     b complex vitamins tablet Take 1 tablet by mouth daily.     Black Cohosh 450 MG CAPS       Calcium 200 MG TABS Take by mouth daily.     Cholecalciferol (VITAMIN D3 PO) Take by mouth.     Docosahexaenoic Acid (DHA FROM [place]GAE) 200 MG CAPS      Ginkgo 60 MG TABS      GINSENG CHINESE RED PO      IRON PO Take by mouth daily.     Multiple Vitamin (MULTIVITAMIN) tablet Take 1 tablet by mouth daily.     Probiotic Product (PROBIOTIC ACIDOPHILUS BEADS PO) Take by mouth daily.     QUERCETIN PO Take by mouth.     vitamin E 400 UNIT/15ML LIQD      estradiol (ESTRING) 7.5 MCG/24HR vaginal ring Place vaginally every 3 months as directed. 1 each 3   No current facility-administered medications for this visit.    Family History  Problem Relation Age of Onset   Hypertension Mother    Osteoporosis Mother    Alzheimer's disease Mother    Cancer Sister        ? unsure if she does   Hypertension Sister    Osteopenia Father    Heart attack Maternal Grandfather    Osteoporosis Maternal Aunt    Osteoporosis Maternal Grandmother  Osteoporosis Paternal Grandmother    Heart disease Paternal Grandfather    Breast cancer Neg Hx    Colon cancer Neg Hx    Colon polyps Neg Hx    Esophageal cancer Neg Hx    Rectal cancer Neg Hx    Stomach cancer Neg Hx     Review of Systems  All other systems reviewed and are negative.   Exam:   BP 110/74 (BP Location: Right Arm, Patient Position: Sitting, Cuff Size: Normal)   Pulse 78   Ht '5\' 1"'$  (1.549 m)   Wt 123 lb (55.8 kg)   LMP 02/16/2007   SpO2 99%   BMI 23.24 kg/m     General appearance: alert, cooperative and appears stated age Head: normocephalic, without obvious abnormality, atraumatic Neck: no adenopathy, supple, symmetrical, trachea midline and thyroid normal to inspection and palpation Lungs: clear to auscultation bilaterally Breasts: normal appearance, no masses or tenderness, No nipple retraction or dimpling, No nipple discharge or bleeding, No axillary adenopathy Heart: regular rate and rhythm Abdomen: soft, non-tender; no masses,  no organomegaly Extremities: extremities normal, atraumatic, no cyanosis or edema Skin: skin color, texture, turgor normal. No rashes or lesions Lymph nodes: cervical, supraclavicular, and axillary nodes normal. Neurologic: grossly normal  Pelvic: External genitalia:  no lesions              No abnormal inguinal nodes palpated.              Urethra:  normal appearing urethra with no masses, tenderness or lesions              Bartholins and Skenes: normal                 Vagina: normal appearing vagina with normal color and discharge, no lesions              Cervix: no lesions              Pap taken: yes Bimanual Exam:  Uterus:  normal size, contour, position, consistency, mobility, non-tender              Adnexa: no mass, fullness, tenderness              Rectal exam: yes.  Confirms.              Anus:  normal sphincter tone, no lesions  Chaperone was present for exam:  Raquel Sarna.   Estring removed by provider for the examination to be performed.  Patient will place it back again.   Assessment:   Well woman visit with gynecologic exam. Cervical cancer screening.  Vaginal atrophy.  Menopause.  FH osteoporosis. Hx colon polyp.   Plan: Mammogram screening discussed. Self breast awareness reviewed. Pap and HR HPV as above. Guidelines for Calcium, Vitamin D, regular exercise program including cardiovascular and weight bearing exercise. Rx for Estring for one year.  Potential effect on breast cancer discussed.   BMD ordered.  Follow up annually and prn.   After visit summary provided.

## 2022-04-21 ENCOUNTER — Ambulatory Visit: Payer: BC Managed Care – PPO | Admitting: Internal Medicine

## 2022-04-21 ENCOUNTER — Encounter: Payer: Self-pay | Admitting: Internal Medicine

## 2022-04-21 DIAGNOSIS — G5603 Carpal tunnel syndrome, bilateral upper limbs: Secondary | ICD-10-CM | POA: Diagnosis not present

## 2022-04-21 NOTE — Assessment & Plan Note (Signed)
Chronic Have worsened despite conservative measures-she does wear her wrist brace consistently Symptoms are more persistent during the day with different activities and she has occasional pain No weakness or muscle wasting Will refer to orthopedics She will also look into acupuncture

## 2022-04-21 NOTE — Patient Instructions (Signed)
      Acupuncture  - Dr Rene Kocher    Medications changes include :   none    A referral was ordered for [facility].     Someone will call you to schedule an appointment.

## 2022-04-24 ENCOUNTER — Other Ambulatory Visit: Payer: Self-pay

## 2022-04-24 ENCOUNTER — Other Ambulatory Visit (HOSPITAL_COMMUNITY): Payer: Self-pay

## 2022-04-25 ENCOUNTER — Other Ambulatory Visit (HOSPITAL_COMMUNITY): Payer: Self-pay

## 2022-04-28 DIAGNOSIS — F902 Attention-deficit hyperactivity disorder, combined type: Secondary | ICD-10-CM | POA: Diagnosis not present

## 2022-05-03 ENCOUNTER — Other Ambulatory Visit (HOSPITAL_COMMUNITY)
Admission: RE | Admit: 2022-05-03 | Discharge: 2022-05-03 | Disposition: A | Discharge status: Home / Self Care | Payer: BC Managed Care – PPO | Source: Ambulatory Visit | Attending: Obstetrics and Gynecology | Admitting: Obstetrics and Gynecology

## 2022-05-03 ENCOUNTER — Ambulatory Visit (INDEPENDENT_AMBULATORY_CARE_PROVIDER_SITE_OTHER): Payer: BC Managed Care – PPO | Admitting: Obstetrics and Gynecology

## 2022-05-03 ENCOUNTER — Encounter: Payer: Self-pay | Admitting: Obstetrics and Gynecology

## 2022-05-03 ENCOUNTER — Other Ambulatory Visit (HOSPITAL_COMMUNITY): Payer: Self-pay

## 2022-05-03 VITALS — BP 110/74 | HR 78 | Ht 61.0 in | Wt 123.0 lb

## 2022-05-03 DIAGNOSIS — Z124 Encounter for screening for malignant neoplasm of cervix: Secondary | ICD-10-CM | POA: Diagnosis not present

## 2022-05-03 DIAGNOSIS — Z01419 Encounter for gynecological examination (general) (routine) without abnormal findings: Secondary | ICD-10-CM | POA: Diagnosis not present

## 2022-05-03 DIAGNOSIS — N952 Postmenopausal atrophic vaginitis: Secondary | ICD-10-CM

## 2022-05-03 DIAGNOSIS — Z780 Asymptomatic menopausal state: Secondary | ICD-10-CM

## 2022-05-03 MED ORDER — ESTRING 2 MG VA RING
VAGINAL_RING | VAGINAL | 3 refills | Status: DC
  Filled 2022-05-03: qty 1, fill #0
  Filled 2022-07-17: qty 1, 90d supply, fill #0
  Filled 2022-10-15: qty 1, 90d supply, fill #1
  Filled 2023-01-13: qty 1, 90d supply, fill #2
  Filled 2023-04-15: qty 1, 90d supply, fill #3

## 2022-05-03 NOTE — Addendum Note (Signed)
Addended by: Yisroel Ramming, Dietrich Pates E on: 05/03/2022 01:03 PM   Modules accepted: Orders

## 2022-05-03 NOTE — Patient Instructions (Addendum)
Calcium Content in Foods Calcium is the most abundant mineral in the body. Most of the body's calcium supply is stored in bones and teeth. Calcium helps many parts of the body function normally, including: Blood and blood vessels. Nerves. Hormones. Muscles. Bones and teeth. When your calcium stores are low, you may be at risk for low bone mass, bone loss, and broken bones (fractures). When you get enough calcium, it helps to support strong bones and teeth throughout your life. Calcium is especially important for: Children during growth spurts. Girls during adolescence. Women who are pregnant or breastfeeding. Women after their menstrual cycle stops (postmenopause). Women whose menstrual cycle has stopped due to anorexia nervosa or regular intense exercise. People who cannot eat or digest dairy products. Vegans. Recommended daily amounts of calcium: Women (ages 73 to 44): 1,000 mg per day. Women (ages 44 and older): 1,200 mg per day. Men (ages 35 to 74): 1,000 mg per day. Men (ages 87 and older): 1,200 mg per day. Women (ages 29 to 79): 1,300 mg per day. Men (ages 4 to 11): 1,300 mg per day. General information Eat foods that are high in calcium. Try to get most of your calcium from food. Some people may benefit from taking calcium supplements. Check with your health care provider or diet and nutrition specialist (dietitian) before starting any calcium supplements. Calcium supplements may interact with certain medicines. Too much calcium may cause other health problems, such as constipation and kidney stones. For the body to absorb calcium, it needs vitamin D. Sources of vitamin D include: Skin exposure to direct sunlight. Foods, such as egg yolks, liver, mushrooms, saltwater fish, and fortified milk. Vitamin D supplements. Check with your health care provider or dietitian before starting any vitamin D supplements. What foods are high in calcium?  Foods that are high in calcium contain  more than 100 milligrams per serving. Fruits Fortified orange juice or other fruit juice, 300 mg per 8 oz serving. Vegetables Collard greens, 360 mg per 8 oz serving. Kale, 100 mg per 8 oz serving. Bok choy, 160 mg per 8 oz serving. Grains Fortified ready-to-eat cereals, 100 to 1,000 mg per 8 oz serving. Fortified frozen waffles, 200 mg in 2 waffles. Oatmeal, 140 mg in 1 cup. Meats and other proteins Sardines, canned with bones, 325 mg per 3 oz serving. Salmon, canned with bones, 180 mg per 3 oz serving. Canned shrimp, 125 mg per 3 oz serving. Baked beans, 160 mg per 4 oz serving. Tofu, firm, made with calcium sulfate, 253 mg per 4 oz serving. Dairy Yogurt, plain, low-fat, 310 mg per 6 oz serving. Nonfat milk, 300 mg per 8 oz serving. American cheese, 195 mg per 1 oz serving. Cheddar cheese, 205 mg per 1 oz serving. Cottage cheese 2%, 105 mg per 4 oz serving. Fortified soy, rice, or almond milk, 300 mg per 8 oz serving. Mozzarella, part skim, 210 mg per 1 oz serving. The items listed above may not be a complete list of foods high in calcium. Actual amounts of calcium may be different depending on processing. Contact a dietitian for more information. What foods are lower in calcium? Foods that are lower in calcium contain 50 mg or less per serving. Fruits Apple, about 6 mg. Banana, about 12 mg. Vegetables Lettuce, 19 mg per 2 oz serving. Tomato, about 11 mg. Grains Rice, 4 mg per 6 oz serving. Boiled potatoes, 14 mg per 8 oz serving. White bread, 6 mg per slice. Meats and other proteins  Egg, 27 mg per 2 oz serving. Red meat, 7 mg per 4 oz serving. Chicken, 17 mg per 4 oz serving. Fish, cod, or trout, 20 mg per 4 oz serving. Dairy Cream cheese, regular, 14 mg per 1 Tbsp serving. Brie cheese, 50 mg per 1 oz serving. Parmesan cheese, 70 mg per 1 Tbsp serving. The items listed above may not be a complete list of foods lower in calcium. Actual amounts of calcium may be  different depending on processing. Contact a dietitian for more information. Summary Calcium is an important mineral in the body because it affects many functions. Getting enough calcium helps support strong bones and teeth throughout your life. Try to get most of your calcium from food. Calcium supplements may interact with certain medicines. Check with your health care provider or dietitian before starting any calcium supplements. This information is not intended to replace advice given to you by your health care provider. Make sure you discuss any questions you have with your health care provider. Document Revised: 07/30/2019 Document Reviewed: 07/30/2019 Elsevier Patient Education  [place] AND DIET:  We recommended that you start or continue a regular exercise program for good health. Regular exercise means any activity that makes your heart beat faster and makes you sweat.  We recommend exercising at least 30 minutes per day at least 3 days a week, preferably 4 or 5.  We also recommend a diet low in fat and sugar.  Inactivity, poor dietary choices and obesity can cause diabetes, heart attack, stroke, and kidney damage, among others.    ALCOHOL AND SMOKING:  Women should limit their alcohol intake to no more than 7 drinks/beers/glasses of wine (combined, not each!) per week. Moderation of alcohol intake to this level decreases your risk of breast cancer and liver damage. And of course, no recreational drugs are part of a healthy lifestyle.  And absolutely no smoking or even second hand smoke. Most people know smoking can cause heart and lung diseases, but did you know it also contributes to weakening of your bones? Aging of your skin?  Yellowing of your teeth and nails?  CALCIUM AND VITAMIN D:  Adequate intake of calcium and Vitamin D are recommended.  The recommendations for exact amounts of these supplements seem to change often, but generally speaking 600 mg of calcium  (either carbonate or citrate) and 800 units of Vitamin D per day seems prudent. Certain women may benefit from higher intake of Vitamin D.  If you are among these women, your doctor will have told you during your visit.    PAP SMEARS:  Pap smears, to check for cervical cancer or precancers,  have traditionally been done yearly, although recent scientific advances have shown that most women can have pap smears less often.  However, every woman still should have a physical exam from her gynecologist every year. It will include a breast check, inspection of the vulva and vagina to check for abnormal growths or skin changes, a visual exam of the cervix, and then an exam to evaluate the size and shape of the uterus and ovaries.  And after 53 years of age, a rectal exam is indicated to check for rectal cancers. We will also provide age appropriate advice regarding health maintenance, like when you should have certain vaccines, screening for sexually transmitted diseases, bone density testing, colonoscopy, mammograms, etc.   MAMMOGRAMS:  All women over 51 years old should have a yearly mammogram. Many facilities now offer  a "3D" mammogram, which may cost around $50 extra out of pocket. If possible,  we recommend you accept the option to have the 3D mammogram performed.  It both reduces the number of women who will be called back for extra views which then turn out to be normal, and it is better than the routine mammogram at detecting truly abnormal areas.    COLONOSCOPY:  Colonoscopy to screen for colon cancer is recommended for all women at age 81.  We know, you hate the idea of the prep.  We agree, BUT, having colon cancer and not knowing it is worse!!  Colon cancer so often starts as a polyp that can be seen and removed at colonscopy, which can quite literally save your life!  And if your first colonoscopy is normal and you have no family history of colon cancer, most women don't have to have it again for 10  years.  Once every ten years, you can do something that may end up saving your life, right?  We will be happy to help you get it scheduled when you are ready.  Be sure to check your insurance coverage so you understand how much it will cost.  It may be covered as a preventative service at no cost, but you should check your particular policy.

## 2022-05-04 ENCOUNTER — Other Ambulatory Visit (HOSPITAL_COMMUNITY): Payer: Self-pay

## 2022-05-05 LAB — CYTOLOGY - PAP
Comment: NEGATIVE
Diagnosis: NEGATIVE
High risk HPV: NEGATIVE

## 2022-05-12 DIAGNOSIS — G5603 Carpal tunnel syndrome, bilateral upper limbs: Secondary | ICD-10-CM | POA: Diagnosis not present

## 2022-05-26 DIAGNOSIS — F902 Attention-deficit hyperactivity disorder, combined type: Secondary | ICD-10-CM | POA: Diagnosis not present

## 2022-06-05 DIAGNOSIS — G5603 Carpal tunnel syndrome, bilateral upper limbs: Secondary | ICD-10-CM | POA: Diagnosis not present

## 2022-06-09 DIAGNOSIS — G5603 Carpal tunnel syndrome, bilateral upper limbs: Secondary | ICD-10-CM | POA: Diagnosis not present

## 2022-06-16 HISTORY — PX: CARPAL TUNNEL RELEASE: SHX101

## 2022-06-22 DIAGNOSIS — G5601 Carpal tunnel syndrome, right upper limb: Secondary | ICD-10-CM | POA: Diagnosis not present

## 2022-06-23 DIAGNOSIS — F902 Attention-deficit hyperactivity disorder, combined type: Secondary | ICD-10-CM | POA: Diagnosis not present

## 2022-07-17 ENCOUNTER — Other Ambulatory Visit: Payer: Self-pay

## 2022-07-17 ENCOUNTER — Other Ambulatory Visit (HOSPITAL_COMMUNITY): Payer: Self-pay

## 2022-07-19 ENCOUNTER — Other Ambulatory Visit (HOSPITAL_COMMUNITY): Payer: Self-pay

## 2022-08-04 DIAGNOSIS — F902 Attention-deficit hyperactivity disorder, combined type: Secondary | ICD-10-CM | POA: Diagnosis not present

## 2022-08-17 DIAGNOSIS — G5602 Carpal tunnel syndrome, left upper limb: Secondary | ICD-10-CM | POA: Diagnosis not present

## 2022-10-13 ENCOUNTER — Other Ambulatory Visit: Payer: BC Managed Care – PPO

## 2022-10-16 ENCOUNTER — Other Ambulatory Visit (HOSPITAL_COMMUNITY): Payer: Self-pay

## 2022-10-17 ENCOUNTER — Other Ambulatory Visit (HOSPITAL_COMMUNITY): Payer: Self-pay

## 2022-10-20 ENCOUNTER — Other Ambulatory Visit: Payer: BC Managed Care – PPO

## 2022-10-27 ENCOUNTER — Inpatient Hospital Stay: Admission: RE | Admit: 2022-10-27 | Payer: BC Managed Care – PPO | Source: Ambulatory Visit

## 2022-11-19 ENCOUNTER — Encounter: Payer: Self-pay | Admitting: Internal Medicine

## 2022-11-19 NOTE — Patient Instructions (Addendum)

## 2022-11-19 NOTE — Progress Notes (Unsigned)
Subjective:    Patient ID: Sara Potter, female    DOB: 03-10-1971, 52 y.o.   MRN: [id number]      HPI Camren is here for a Physical exam and her chronic medical problems.    Doing well-no concerns.  Medications and allergies reviewed with patient and updated if appropriate.  Current Outpatient Medications on File Prior to Visit  Medication Sig Dispense Refill   Acetylcysteine (NAC PO) Take by mouth.     b complex vitamins tablet Take 1 tablet by mouth daily.     Black Cohosh 450 MG CAPS      Calcium 200 MG TABS Take by mouth daily.     Cholecalciferol (VITAMIN D3 PO) Take by mouth.     Docosahexaenoic Acid (DHA FROM ALGAE) 200 MG CAPS      estradiol (ESTRING) 7.5 MCG/24HR vaginal ring Insert 1 ring vaginally every 3 months as directed. 1 each 3   Ginkgo 60 MG TABS      GINSENG CHINESE RED PO      IRON PO Take by mouth daily.     Multiple Vitamin (MULTIVITAMIN) tablet Take 1 tablet by mouth daily.     Probiotic Product (PROBIOTIC ACIDOPHILUS BEADS PO) Take by mouth daily.     QUERCETIN PO Take by mouth.     vitamin E 400 UNIT/15ML LIQD      No current facility-administered medications on file prior to visit.    Review of Systems  Constitutional:  Negative for fever.  Eyes:  Negative for visual disturbance.  Respiratory:  Positive for cough (slight - ? sleeping with fan at night). Negative for chest tightness, shortness of breath and wheezing.   Cardiovascular:  Negative for chest pain, palpitations and leg swelling.  Gastrointestinal:  Negative for abdominal pain, blood in stool, constipation and diarrhea.       No gerd  Genitourinary:  Negative for dysuria.  Musculoskeletal:  Positive for back pain (minimal - sittting at day). Negative for arthralgias.  Skin:  Negative for rash.  Neurological:  Positive for headaches (occ - related to gritting teeth). Negative for light-headedness.  Psychiatric/Behavioral:  Negative for dysphoric mood. The patient is not  nervous/anxious.        Objective:   Vitals:   11/20/22 0810  BP: 106/70  Pulse: 67  Temp: 98.3 F (36.8 C)  SpO2: 97%   Filed Weights   11/20/22 0810  Weight: 122 lb 6.4 oz (55.5 kg)   Body mass index is 23.13 kg/m.  BP Readings from Last 3 Encounters:  11/20/22 106/70  05/03/22 110/74  04/21/22 120/76    Wt Readings from Last 3 Encounters:  11/20/22 122 lb 6.4 oz (55.5 kg)  05/03/22 123 lb (55.8 kg)  04/21/22 127 lb (57.6 kg)       Physical Exam Constitutional: She appears well-developed and well-nourished. No distress.  HENT:  Head: Normocephalic and atraumatic.  Right Ear: External ear normal. Normal ear canal and TM Left Ear: External ear normal.  Normal ear canal and TM Mouth/Throat: Oropharynx is clear and moist.  Eyes: Conjunctivae normal.  Neck: Neck supple. No tracheal deviation present. No thyromegaly present.  No carotid bruit  Cardiovascular: Normal rate, regular rhythm and normal heart sounds.   No murmur heard.  No edema. Pulmonary/Chest: Effort normal and breath sounds normal. No respiratory distress. She has no wheezes. She has no rales.  Breast: deferred   Abdominal: Soft. She exhibits no distension. There is no tenderness.  Lymphadenopathy:  She has no cervical adenopathy.  Skin: Skin is warm and dry. She is not diaphoretic.  Psychiatric: She has a normal mood and affect. Her behavior is normal.     Lab Results  Component Value Date   WBC 3.4 (L) 11/16/2021   HGB 12.8 11/16/2021   HCT 37.8 11/16/2021   PLT 267.0 11/16/2021   GLUCOSE 87 11/16/2021   CHOL 182 11/16/2021   TRIG 54.0 11/16/2021   HDL 88.30 11/16/2021   LDLCALC 83 11/16/2021   ALT 12 11/16/2021   AST 17 11/16/2021   NA 139 11/16/2021   K 4.0 11/16/2021   CL 104 11/16/2021   CREATININE 0.75 11/16/2021   BUN 7 11/16/2021   CO2 28 11/16/2021   TSH 2.23 11/16/2021         Assessment & Plan:   Physical exam: Screening blood work  ordered Exercise  - regular  - yoga, rowing, weights, bike, run Weight   - normal Substance abuse  none   Reviewed recommended immunizations.   Health Maintenance  Topic Date Due   COVID-19 Vaccine (4 - 2023-24 season) 12/16/2021   INFLUENZA VACCINE  07/16/2023 (Originally 11/16/2022)   MAMMOGRAM  02/11/2023   Colonoscopy  06/19/2023   DTaP/Tdap/Td (2 - Td or Tdap) 09/24/2024   PAP SMEAR-Modifier  05/04/2027   Hepatitis C Screening  Completed   HIV Screening  Completed   HPV VACCINES  Aged Out   Zoster Vaccines- Shingrix  Discontinued          See Problem List for Assessment and Plan of chronic medical problems.

## 2022-11-20 ENCOUNTER — Ambulatory Visit (INDEPENDENT_AMBULATORY_CARE_PROVIDER_SITE_OTHER): Payer: BC Managed Care – PPO | Admitting: Internal Medicine

## 2022-11-20 ENCOUNTER — Encounter: Payer: Self-pay | Admitting: Internal Medicine

## 2022-11-20 VITALS — BP 106/70 | HR 67 | Temp 98.3°F | Ht 61.0 in | Wt 122.4 lb

## 2022-11-20 DIAGNOSIS — Z136 Encounter for screening for cardiovascular disorders: Secondary | ICD-10-CM | POA: Diagnosis not present

## 2022-11-20 DIAGNOSIS — E559 Vitamin D deficiency, unspecified: Secondary | ICD-10-CM | POA: Diagnosis not present

## 2022-11-20 DIAGNOSIS — G5603 Carpal tunnel syndrome, bilateral upper limbs: Secondary | ICD-10-CM | POA: Diagnosis not present

## 2022-11-20 DIAGNOSIS — Z0000 Encounter for general adult medical examination without abnormal findings: Secondary | ICD-10-CM | POA: Diagnosis not present

## 2022-11-20 LAB — LIPID PANEL
Cholesterol: 200 mg/dL (ref 0–200)
HDL: 91.6 mg/dL (ref >39.00)
LDL Cholesterol: 92 mg/dL (ref 0–99)
NonHDL: 108.06
Total CHOL/HDL Ratio: 2
Triglycerides: 78 mg/dL (ref 0.0–149.0)
VLDL: 15.6 mg/dL (ref 0.0–40.0)

## 2022-11-20 LAB — COMPREHENSIVE METABOLIC PANEL
ALT: 14 U/L (ref 0–35)
AST: 20 U/L (ref 0–37)
Albumin: 4.4 g/dL (ref 3.5–5.2)
Alkaline Phosphatase: 62 U/L (ref 39–117)
BUN: 7 mg/dL (ref 6–23)
CO2: 27 mEq/L (ref 19–32)
Calcium: 9.3 mg/dL (ref 8.4–10.5)
Chloride: 103 mEq/L (ref 96–112)
Creatinine, Ser: 0.69 mg/dL (ref 0.40–1.20)
GFR: 99.58 mL/min (ref >60.00)
Glucose, Bld: 87 mg/dL (ref 70–99)
Potassium: 4 mEq/L (ref 3.5–5.1)
Sodium: 139 mEq/L (ref 135–145)
Total Bilirubin: 0.6 mg/dL (ref 0.2–1.2)
Total Protein: 6.7 g/dL (ref 6.0–8.3)

## 2022-11-20 LAB — CBC WITH DIFFERENTIAL/PLATELET
Basophils Absolute: 0.1 10*3/uL (ref 0.0–0.1)
Basophils Relative: 2.1 % (ref 0.0–3.0)
Eosinophils Absolute: 0 10*3/uL (ref 0.0–0.7)
Eosinophils Relative: 0.8 % (ref 0.0–5.0)
HCT: 38.1 % (ref 36.0–46.0)
Hemoglobin: 13 g/dL (ref 12.0–15.0)
Lymphocytes Relative: 25.9 % (ref 12.0–46.0)
Lymphs Abs: 1.1 10*3/uL (ref 0.7–4.0)
MCHC: 34.1 g/dL (ref 30.0–36.0)
MCV: 91.3 fl (ref 78.0–100.0)
Monocytes Absolute: 0.3 10*3/uL (ref 0.1–1.0)
Monocytes Relative: 7.1 % (ref 3.0–12.0)
Neutro Abs: 2.7 10*3/uL (ref 1.4–7.7)
Neutrophils Relative %: 64.1 % (ref 43.0–77.0)
Platelets: 268 10*3/uL (ref 150.0–400.0)
RBC: 4.17 Mil/uL (ref 3.87–5.11)
RDW: 13.1 % (ref 11.5–15.5)
WBC: 4.1 10*3/uL (ref 4.0–10.5)

## 2022-11-20 LAB — VITAMIN D 25 HYDROXY (VIT D DEFICIENCY, FRACTURES): VITD: 65.76 ng/mL (ref 30.00–100.00)

## 2022-11-20 LAB — TSH: TSH: 2.31 u[IU]/mL (ref 0.35–5.50)

## 2022-11-20 NOTE — Assessment & Plan Note (Signed)
Chronic Taking vitamin D daily Check vitamin D level, CMP

## 2022-12-29 ENCOUNTER — Other Ambulatory Visit: Payer: Self-pay | Admitting: Internal Medicine

## 2022-12-29 DIAGNOSIS — Z1231 Encounter for screening mammogram for malignant neoplasm of breast: Secondary | ICD-10-CM

## 2023-01-13 ENCOUNTER — Other Ambulatory Visit (HOSPITAL_COMMUNITY): Payer: Self-pay

## 2023-01-15 ENCOUNTER — Other Ambulatory Visit: Payer: Self-pay

## 2023-01-16 ENCOUNTER — Other Ambulatory Visit: Payer: Self-pay

## 2023-02-16 ENCOUNTER — Ambulatory Visit
Admission: RE | Admit: 2023-02-16 | Discharge: 2023-02-16 | Disposition: A | Discharge status: Home / Self Care | Payer: BC Managed Care – PPO | Source: Ambulatory Visit | Attending: Internal Medicine | Admitting: Internal Medicine

## 2023-02-16 DIAGNOSIS — Z1231 Encounter for screening mammogram for malignant neoplasm of breast: Secondary | ICD-10-CM

## 2023-02-21 ENCOUNTER — Other Ambulatory Visit: Payer: Self-pay | Admitting: Internal Medicine

## 2023-02-21 DIAGNOSIS — R928 Other abnormal and inconclusive findings on diagnostic imaging of breast: Secondary | ICD-10-CM

## 2023-03-05 DIAGNOSIS — H40013 Open angle with borderline findings, low risk, bilateral: Secondary | ICD-10-CM | POA: Diagnosis not present

## 2023-03-06 DIAGNOSIS — F902 Attention-deficit hyperactivity disorder, combined type: Secondary | ICD-10-CM | POA: Diagnosis not present

## 2023-03-09 ENCOUNTER — Ambulatory Visit: Payer: BC Managed Care – PPO

## 2023-03-09 ENCOUNTER — Ambulatory Visit
Admission: RE | Admit: 2023-03-09 | Discharge: 2023-03-09 | Disposition: A | Discharge status: Home / Self Care | Payer: BC Managed Care – PPO | Source: Ambulatory Visit | Attending: Internal Medicine | Admitting: Internal Medicine

## 2023-03-09 DIAGNOSIS — R928 Other abnormal and inconclusive findings on diagnostic imaging of breast: Secondary | ICD-10-CM

## 2023-03-23 DIAGNOSIS — F902 Attention-deficit hyperactivity disorder, combined type: Secondary | ICD-10-CM | POA: Diagnosis not present

## 2023-04-16 ENCOUNTER — Other Ambulatory Visit (HOSPITAL_COMMUNITY): Payer: Self-pay

## 2023-04-17 ENCOUNTER — Other Ambulatory Visit: Payer: Self-pay

## 2023-04-18 DIAGNOSIS — M8580 Other specified disorders of bone density and structure, unspecified site: Secondary | ICD-10-CM

## 2023-04-18 HISTORY — DX: Other specified disorders of bone density and structure, unspecified site: M85.80

## 2023-04-19 ENCOUNTER — Other Ambulatory Visit (HOSPITAL_COMMUNITY): Payer: Self-pay

## 2023-04-27 DIAGNOSIS — F902 Attention-deficit hyperactivity disorder, combined type: Secondary | ICD-10-CM | POA: Diagnosis not present

## 2023-05-04 ENCOUNTER — Ambulatory Visit
Admission: RE | Admit: 2023-05-04 | Discharge: 2023-05-04 | Disposition: A | Discharge status: Home / Self Care | Payer: BC Managed Care – PPO | Source: Ambulatory Visit | Attending: Obstetrics and Gynecology | Admitting: Obstetrics and Gynecology

## 2023-05-04 DIAGNOSIS — M8588 Other specified disorders of bone density and structure, other site: Secondary | ICD-10-CM | POA: Diagnosis not present

## 2023-05-04 DIAGNOSIS — Z780 Asymptomatic menopausal state: Secondary | ICD-10-CM

## 2023-05-04 DIAGNOSIS — E2839 Other primary ovarian failure: Secondary | ICD-10-CM | POA: Diagnosis not present

## 2023-05-04 DIAGNOSIS — N958 Other specified menopausal and perimenopausal disorders: Secondary | ICD-10-CM | POA: Diagnosis not present

## 2023-05-05 ENCOUNTER — Encounter: Payer: Self-pay | Admitting: Obstetrics and Gynecology

## 2023-05-06 ENCOUNTER — Encounter: Payer: Self-pay | Admitting: Internal Medicine

## 2023-05-06 DIAGNOSIS — M8580 Other specified disorders of bone density and structure, unspecified site: Secondary | ICD-10-CM | POA: Insufficient documentation

## 2023-05-25 DIAGNOSIS — F902 Attention-deficit hyperactivity disorder, combined type: Secondary | ICD-10-CM | POA: Diagnosis not present

## 2023-06-04 NOTE — Progress Notes (Addendum)
 53 y.o. G50P0000 Married Caucasian female here for annual exam.    Has dx of osteopenia.   Estring is helpful for vaginal atrophy.  Has a therapist that she sees as needed.   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   PCP: Pincus Sanes, MD   Patient's last menstrual period was 02/16/2007.           Sexually active: Yes.    The current method of family planning is post menopausal status.    Menopausal hormone therapy:  n/a Exercising: Yes.     Yoga, jump rope, stairmaster, rowing machine, weight lifting, biking Smoker:  no  OB History  Gravida Para Term Preterm AB Living  0 0 0 0 0 0  SAB IAB Ectopic Multiple Live Births  0 0 0 0      HEALTH MAINTENANCE: Last 2 paps:  05/03/22 neg: HR HPV neg, 12/14/17 neg: HR HPV neg History of abnormal Pap or positive HPV:  no Mammogram:   03/09/23 Breast Density Cat C, BI-RADS CAT 1 neg Colonoscopy:  06/18/20 - scheduled for this month due to polpy formation.  Bone Density:  05/04/23  Result  osteopenia   Immunization History  Administered Date(s) Administered   Influenza, Quadrivalent, Recombinant, Inj, Pf 03/14/2013   Influenza,Quad,Nasal, Live 02/02/2007   Influenza,inj,Quad PF,6+ Mos 01/20/2017, 12/29/2017, 12/13/2018   PFIZER(Purple Top)SARS-COV-2 Vaccination 05/07/2019, 05/28/2019, 01/25/2020   Tdap 09/25/2014      reports that she has never smoked. She has never used smokeless tobacco. She reports current alcohol use of about 3.0 standard drinks of alcohol per week. She reports that she does not use drugs.  Past Medical History:  Diagnosis Date   Abnormal Pap smear of cervix    just a repeat a long time ago   Allergy    no testing    Depression    Heart murmur    Hypertension    Osteopenia 2025   hip and spine   STD (sexually transmitted disease)    chlamydia age 40   Urinary incontinence     Past Surgical History:  Procedure Laterality Date   APPENDECTOMY     BREAST EXCISIONAL BIOPSY Right 2000   CARPAL TUNNEL  RELEASE Bilateral 06/2022   DILATION AND CURETTAGE OF UTERUS     age 76   INTRAUTERINE DEVICE INSERTION     mirena removed & re-inserted 06-16-16   TONSILLECTOMY AND ADENOIDECTOMY     TYMPANOSTOMY TUBE PLACEMENT      Current Outpatient Medications  Medication Sig Dispense Refill   Acetylcysteine (NAC PO) Take by mouth.     b complex vitamins tablet Take 1 tablet by mouth daily.     Black Cohosh 450 MG CAPS      Calcium Carbonate-Vit D-Min (CALCIUM 1200 PO)      Cholecalciferol (VITAMIN D3 PO) Take by mouth.     Docosahexaenoic Acid (DHA FROM ALGAE) 200 MG CAPS      estradiol (ESTRING) 7.5 MCG/24HR vaginal ring Insert 1 ring vaginally every 3 months as directed. 1 each 3   Ginkgo 60 MG TABS      GINSENG CHINESE RED PO      IRON PO Take by mouth daily.     Multiple Vitamin (MULTIVITAMIN) tablet Take 1 tablet by mouth daily.     Probiotic Product (PROBIOTIC ACIDOPHILUS BEADS PO) Take by mouth daily.     QUERCETIN PO Take by mouth.     vitamin E 400 UNIT/15ML LIQD  No current facility-administered medications for this visit.    ALLERGIES: Patient has no known allergies.  Family History  Problem Relation Age of Onset   Hypertension Mother    Osteoporosis Mother    Alzheimer's disease Mother    Cancer Sister        ? unsure if she does   Hypertension Sister    Osteopenia Father    Heart attack Maternal Grandfather    Osteoporosis Maternal Aunt    Osteoporosis Maternal Grandmother    Osteoporosis Paternal Grandmother    Heart disease Paternal Grandfather    Breast cancer Neg Hx    Colon cancer Neg Hx    Colon polyps Neg Hx    Esophageal cancer Neg Hx    Rectal cancer Neg Hx    Stomach cancer Neg Hx     Review of Systems  All other systems reviewed and are negative.   PHYSICAL EXAM:  BP 126/80 (BP Location: Right Arm, Patient Position: Sitting, Cuff Size: Small)   Pulse 82   Ht 5' 2.5" (1.588 m)   Wt 124 lb (56.2 kg)   LMP 02/16/2007   SpO2 99%   BMI 22.32  kg/m     General appearance: alert, cooperative and appears stated age Head: normocephalic, without obvious abnormality, atraumatic Neck: no adenopathy, supple, symmetrical, trachea midline and thyroid normal to inspection and palpation Lungs: clear to auscultation bilaterally Breasts: normal appearance, no masses or tenderness, No nipple retraction or dimpling, No nipple discharge or bleeding, No axillary adenopathy Heart: regular rate and rhythm Abdomen: soft, non-tender; no masses, no organomegaly Extremities: extremities normal, atraumatic, no cyanosis or edema Skin: skin color, texture, turgor normal. No rashes or lesions Lymph nodes: cervical, supraclavicular, and axillary nodes normal. Neurologic: grossly normal  Pelvic: External genitalia:  no lesions              No abnormal inguinal nodes palpated.              Urethra:  normal appearing urethra with no masses, tenderness or lesions              Bartholins and Skenes: normal                 Vagina: normal appearing vagina with normal color and discharge, no lesions              Cervix: no lesions              Pap taken: No. Bimanual Exam:  Uterus:  normal size, contour, position, consistency, mobility, non-tender              Adnexa: no mass, fullness, tenderness              Rectal exam: Yes.  .  Confirms.              Anus:  normal sphincter tone, no lesions  Estring removed for pelvic exam and was then replaced.  Chaperone was present for exam:  Warren Lacy, CMA  ASSESSMENT: Well woman visit with gynecologic exam. Vaginal atrophy.  Using Estring.  Menopause.  Osteopenia.  FH osteoporosis. Hx colon polyp.  PHQ-9: 2.  Has therapist.  PLAN: Mammogram screening discussed. Self breast awareness reviewed. Pap and HRV collected:  No.  Due in 2029.  Guidelines for Calcium, Vitamin D, regular exercise program including cardiovascular and weight bearing exercise. Medication refills:  Estring.  I discussed potential effect  on breast cancer.  BMD in 2 years.  Labs  with PCP.  Follow up:  yearly and prn.

## 2023-06-08 ENCOUNTER — Ambulatory Visit (AMBULATORY_SURGERY_CENTER): Payer: BC Managed Care – PPO | Admitting: *Deleted

## 2023-06-08 VITALS — Ht 61.0 in | Wt 120.0 lb

## 2023-06-08 DIAGNOSIS — Z860100 Personal history of colon polyps, unspecified: Secondary | ICD-10-CM

## 2023-06-08 MED ORDER — SUFLAVE 178.7 G PO SOLR: 1.0000 | Freq: Once | ORAL | 0 refills | Status: AC

## 2023-06-08 NOTE — Progress Notes (Signed)
 Pt's name and DOB verified at the beginning of the pre-visit wit 2 identifiers  Permission given to speak with  Pt denies any difficulty with ambulating,sitting, laying down or rolling side to side  Pt has no issues with ambulation   Pt has no issues moving head neck or swallowing  No egg or soy allergy known to patient   No issues known to pt with past sedation with any surgeries or procedures  Pt denies having issues being intubated  No FH of Malignant Hyperthermia  Pt is not on diet pills or shots  Pt is not on home 02   Pt is not on blood thinners   Pt denies issues with constipation   Pt is not on dialysis  Pt says she has a slight murmur  Pt denies any upcoming cardiac testing  Patient's chart reviewed by Cathlyn Parsons CNRA prior to pre-visit and patient appropriate for the [facility].  Pre-visit completed and red dot placed by patient's name on their procedure day (on provider's schedule).    Visit by phone  Pt states weight is 120 lb  IInstructions reviewed. Pt given [facility], [facility] main # and MD on call # prior to instructions.  Pt states understanding of instructions. Instructed to review again prior to procedure. Pt states they will.   Informed pt that they will receive a text or  call from [facility] regarding there prep med.

## 2023-06-18 ENCOUNTER — Encounter: Payer: Self-pay | Admitting: Obstetrics and Gynecology

## 2023-06-18 ENCOUNTER — Other Ambulatory Visit (HOSPITAL_COMMUNITY): Payer: Self-pay

## 2023-06-18 ENCOUNTER — Ambulatory Visit (INDEPENDENT_AMBULATORY_CARE_PROVIDER_SITE_OTHER): Payer: BC Managed Care – PPO | Admitting: Obstetrics and Gynecology

## 2023-06-18 VITALS — BP 126/80 | HR 82 | Ht 62.5 in | Wt 124.0 lb

## 2023-06-18 DIAGNOSIS — Z1331 Encounter for screening for depression: Secondary | ICD-10-CM | POA: Diagnosis not present

## 2023-06-18 DIAGNOSIS — Z01419 Encounter for gynecological examination (general) (routine) without abnormal findings: Secondary | ICD-10-CM

## 2023-06-18 MED ORDER — ESTRING 2 MG VA RING
VAGINAL_RING | VAGINAL | 3 refills | Status: AC
  Filled 2023-06-18: qty 1, fill #0
  Filled 2023-07-10: qty 1, 90d supply, fill #0
  Filled 2023-10-12: qty 1, 90d supply, fill #1
  Filled 2024-01-08 – 2024-01-12 (×2): qty 1, 90d supply, fill #2
  Filled 2024-04-07: qty 1, 90d supply, fill #3

## 2023-06-18 NOTE — Patient Instructions (Addendum)
 EXERCISE AND DIET:  We recommended that you start or continue a regular exercise program for good health. Regular exercise means any activity that makes your heart beat faster and makes you sweat.  We recommend exercising at least 30 minutes per day at least 3 days a week, preferably 4 or 5.  We also recommend a diet low in fat and sugar.  Inactivity, poor dietary choices and obesity can cause diabetes, heart attack, stroke, and kidney damage, among others.    ALCOHOL AND SMOKING:  Women should limit their alcohol intake to no more than 7 drinks/beers/glasses of wine (combined, not each!) per week. Moderation of alcohol intake to this level decreases your risk of breast cancer and liver damage. And of course, no recreational drugs are part of a healthy lifestyle.  And absolutely no smoking or even second hand smoke. Most people know smoking can cause heart and lung diseases, but did you know it also contributes to weakening of your bones? Aging of your skin?  Yellowing of your teeth and nails?  CALCIUM AND VITAMIN D:  Adequate intake of calcium and Vitamin D are recommended.  The recommendations for exact amounts of these supplements seem to change often, but generally speaking 600 mg of calcium (either carbonate or citrate) and 800 units of Vitamin D per day seems prudent. Certain women may benefit from higher intake of Vitamin D.  If you are among these women, your doctor will have told you during your visit.    PAP SMEARS:  Pap smears, to check for cervical cancer or precancers,  have traditionally been done yearly, although recent scientific advances have shown that most women can have pap smears less often.  However, every woman still should have a physical exam from her gynecologist every year. It will include a breast check, inspection of the vulva and vagina to check for abnormal growths or skin changes, a visual exam of the cervix, and then an exam to evaluate the size and shape of the uterus and  ovaries.  And after 54 years of age, a rectal exam is indicated to check for rectal cancers. We will also provide age appropriate advice regarding health maintenance, like when you should have certain vaccines, screening for sexually transmitted diseases, bone density testing, colonoscopy, mammograms, etc.   MAMMOGRAMS:  All women over 19 years old should have a yearly mammogram. Many facilities now offer a "3D" mammogram, which may cost around $50 extra out of pocket. If possible,  we recommend you accept the option to have the 3D mammogram performed.  It both reduces the number of women who will be called back for extra views which then turn out to be normal, and it is better than the routine mammogram at detecting truly abnormal areas.    COLONOSCOPY:  Colonoscopy to screen for colon cancer is recommended for all women at age 54.  We know, you hate the idea of the prep.  We agree, BUT, having colon cancer and not knowing it is worse!!  Colon cancer so often starts as a polyp that can be seen and removed at colonscopy, which can quite literally save your life!  And if your first colonoscopy is normal and you have no family history of colon cancer, most women don't have to have it again for 10 years.  Once every ten years, you can do something that may end up saving your life, right?  We will be happy to help you get it scheduled when you are ready.  Be sure to check your insurance coverage so you understand how much it will cost.  It may be covered as a preventative service at no cost, but you should check your particular policy.     Osteopenia  Osteopenia is a loss of thickness (density) inside the bones. Another name for osteopenia is low bone mass. Mild osteopenia is a normal part of aging. It is not a disease, and it does not cause symptoms. However, if you have osteopenia and continue to lose bone mass, you could develop a condition that causes the bones to become thin and break more easily  (osteoporosis). Osteoporosis can cause you to lose some height, have back pain, and have a stooped posture. Although osteopenia is not a disease, making changes to your lifestyle and diet can help to prevent osteopenia from developing into osteoporosis. What are the causes? Osteopenia is caused by loss of calcium in the bones. Bones are constantly changing. Old bone cells are continually being replaced with new bone cells. This process builds new bone. The mineral calcium is needed to build new bone and maintain bone density. Bone density is usually highest around age 54. After that, most people's bodies cannot replace all the bone they have lost with new bone. What increases the risk? You are more likely to develop this condition if: You are older than age 54. You are a woman who went through menopause early. You have a long illness that keeps you in bed. You do not get enough exercise. You lack certain nutrients (malnutrition). You have an overactive thyroid gland (hyperthyroidism). You use products that contain nicotine or tobacco, such as cigarettes, e-cigarettes and chewing tobacco, or you drink a lot of alcohol. You are taking medicines that weaken the bones, such as steroids. What are the signs or symptoms? This condition does not cause any symptoms. You may have a slightly higher risk for bone breaks (fractures), so getting fractures more easily than normal may be an indication of osteopenia. How is this diagnosed? This condition may be diagnosed based on an X-ray exam that measures bone density (dual-energy X-ray absorptiometry, or DEXA). This test can measure bone density in your hips, spine, and wrists. Osteopenia has no symptoms, so this condition is usually diagnosed after a routine bone density screening test is done for osteoporosis. This routine screening is usually done for: Women who are age 54 or older. Men who are age 54 or older. If you have risk factors for osteopenia, you  may have the screening test at an earlier age. How is this treated? Making dietary and lifestyle changes can lower your risk for osteoporosis. If you have severe osteopenia that is close to becoming osteoporosis, this condition can be treated with medicines and dietary supplements such as calcium and vitamin D. These supplements help to rebuild bone density. Follow these instructions at home: Eating and drinking Eat a diet that is high in calcium and vitamin D. Calcium is found in dairy products, beans, salmon, and leafy green vegetables like spinach and broccoli. Look for foods that have vitamin D and calcium added to them (fortified foods), such as orange juice, cereal, and bread.  Lifestyle Do 30 minutes or more of a weight-bearing exercise every day, such as walking, jogging, or playing a sport. These types of exercises strengthen the bones. Do not use any products that contain nicotine or tobacco, such as cigarettes, e-cigarettes, and chewing tobacco. If you need help quitting, ask your health care provider. Do not drink alcohol  if: Your health care provider tells you not to drink. You are pregnant, may be pregnant, or are planning to become pregnant. If you drink alcohol: Limit how much you use to: 0-1 drink a day for women. 0-2 drinks a day for men. Be aware of how much alcohol is in your drink. In the [place] one drink equals one 12 oz bottle of beer (355 mL), one 5 oz glass of wine (148 mL), or one 1 oz glass of hard liquor (44 mL). General instructions Take over-the-counter and prescription medicines only as told by your health care provider. These include vitamins and supplements. Take precautions at home to lower your risk of falling, such as: Keeping rooms well-lit and free of clutter, such as cords. Installing safety rails on stairs. Using rubber mats in the bathroom or other areas that are often wet or slippery. Keep all follow-up visits. This is important. Contact a health  care provider if: You have not had a bone density screening for osteoporosis and you are: A woman who is age 36 or older. A man who is age 35 or older. You are a postmenopausal woman who has not had a bone density screening for osteoporosis. You are older than age 30 and you want to know if you should have bone density screening for osteoporosis. Summary Osteopenia is a loss of thickness (density) inside the bones. Another name for osteopenia is low bone mass. Osteopenia is not a disease, but it may increase your risk for a condition that causes the bones to become thin and break more easily (osteoporosis). You may be at risk for osteopenia if you are older than age 59 or if you are a woman who went through early menopause. Osteopenia does not cause any symptoms, but it can be diagnosed with a bone density screening test. Dietary and lifestyle changes are the first treatment for osteopenia. These may lower your risk for osteoporosis. This information is not intended to replace advice given to you by your health care provider. Make sure you discuss any questions you have with your health care provider. Document Revised: 12/19/2022 Document Reviewed: 12/19/2022 Elsevier Patient Education  2024 [employer].

## 2023-06-26 ENCOUNTER — Encounter: Payer: Self-pay | Admitting: Internal Medicine

## 2023-06-27 ENCOUNTER — Telehealth: Payer: Self-pay | Admitting: Internal Medicine

## 2023-06-27 NOTE — Telephone Encounter (Signed)
 Inbound call from patient stating that she is scheduled for a colonoscopy on 3/21 at 3:00 with Dr. Rhea Belton and is requesting a call to discuss if she can continue to take her supplements the day before and day of. Please advise.

## 2023-06-27 NOTE — Telephone Encounter (Signed)
 RTN call to patient, VM obtained and message left that RN was returning her call.

## 2023-06-28 ENCOUNTER — Encounter: Payer: Self-pay | Admitting: Internal Medicine

## 2023-07-03 ENCOUNTER — Encounter: Payer: Self-pay | Admitting: Internal Medicine

## 2023-07-03 ENCOUNTER — Encounter: Payer: Self-pay | Admitting: Certified Registered Nurse Anesthetist

## 2023-07-03 NOTE — Telephone Encounter (Signed)
 Received My Chart message from another RN and was instructed to wait and see how she feels on day of procedure but not to come and call reschedule if she has a fever that day. Reviewed with pt that if she does not have a fever but has a cough to wear a mask when she comes. Pt states she understands. No other questions at this time.

## 2023-07-03 NOTE — Telephone Encounter (Signed)
 Patient called stated she has been having cold symptoms and had a fever yesterday. Would like to know if she can still proceed with having her procedure on 07/06/23. Please advise.

## 2023-07-06 ENCOUNTER — Ambulatory Visit: Payer: BC Managed Care – PPO | Admitting: Internal Medicine

## 2023-07-06 ENCOUNTER — Encounter: Payer: Self-pay | Admitting: Internal Medicine

## 2023-07-06 VITALS — BP 113/70 | HR 68 | Temp 97.5°F | Resp 13 | Ht 61.0 in | Wt 120.0 lb

## 2023-07-06 DIAGNOSIS — Z860101 Personal history of adenomatous and serrated colon polyps: Secondary | ICD-10-CM

## 2023-07-06 DIAGNOSIS — Z1211 Encounter for screening for malignant neoplasm of colon: Secondary | ICD-10-CM | POA: Diagnosis not present

## 2023-07-06 DIAGNOSIS — Z860100 Personal history of colon polyps, unspecified: Secondary | ICD-10-CM

## 2023-07-06 MED ORDER — SODIUM CHLORIDE 0.9 % IV SOLN: 500.0000 mL | Freq: Once | INTRAVENOUS | Status: DC

## 2023-07-06 NOTE — Progress Notes (Signed)
 [facility] History and Physical   Primary Care Physician:  Pincus Sanes, MD   Reason for Procedure:   Hx colon polyps  Plan:    colonoscopy     HPI: Jazzlynn Rawe is a 53 y.o. female here for surveillance exam. 2022 - 4 diminutive polyps -  2 adenomas and 2 ssp's   Past Medical History:  Diagnosis Date   Abnormal Pap smear of cervix    just a repeat a long time ago   Allergy    no testing    Depression    Heart murmur    Hypertension    Osteopenia 2025   hip and spine   STD (sexually transmitted disease)    chlamydia age 41   Urinary incontinence     Past Surgical History:  Procedure Laterality Date   APPENDECTOMY     BREAST EXCISIONAL BIOPSY Right 2000   CARPAL TUNNEL RELEASE Bilateral 06/2022   DILATION AND CURETTAGE OF UTERUS     age 89   INTRAUTERINE DEVICE INSERTION     mirena removed & re-inserted 06-16-16   TONSILLECTOMY AND ADENOIDECTOMY     TYMPANOSTOMY TUBE PLACEMENT      Prior to Admission medications   Medication Sig Start Date End Date Taking? Authorizing Provider  Acetylcysteine (NAC PO) Take by mouth.    [provider]  b complex vitamins tablet Take 1 tablet by mouth daily.    [provider]  Black Cohosh 450 MG CAPS  07/17/19   [provider]  Calcium Carbonate-Vit D-Min (CALCIUM 1200 PO)  05/07/23   [provider]  Cholecalciferol (VITAMIN D3 PO) Take by mouth.    [provider]  Docosahexaenoic Acid (DHA FROM ALGAE) 200 MG CAPS  04/17/16   [provider]  estradiol (ESTRING) 7.5 MCG/24HR vaginal ring Insert 1 ring vaginally every 3 months as directed. 06/18/23   Patton Salles, MD  Ginkgo 60 MG TABS  12/17/18   [provider]  GINSENG CHINESE RED PO  07/17/19   [provider]  IRON PO Take by mouth daily.    [provider]  Multiple Vitamin (MULTIVITAMIN) tablet Take 1 tablet by mouth daily.    [provider]  Probiotic  Product (PROBIOTIC ACIDOPHILUS BEADS PO) Take by mouth daily.    [provider]  QUERCETIN PO Take by mouth.    [provider]  vitamin E 400 UNIT/15ML LIQD     [provider]    Current Outpatient Medications  Medication Sig Dispense Refill   Acetylcysteine (NAC PO) Take by mouth.     b complex vitamins tablet Take 1 tablet by mouth daily.     Black Cohosh 450 MG CAPS      Calcium Carbonate-Vit D-Min (CALCIUM 1200 PO)      Cholecalciferol (VITAMIN D3 PO) Take by mouth.     Docosahexaenoic Acid (DHA FROM ALGAE) 200 MG CAPS      estradiol (ESTRING) 7.5 MCG/24HR vaginal ring Insert 1 ring vaginally every 3 months as directed. 1 each 3   Ginkgo 60 MG TABS      GINSENG CHINESE RED PO      IRON PO Take by mouth daily.     Multiple Vitamin (MULTIVITAMIN) tablet Take 1 tablet by mouth daily.     Probiotic Product (PROBIOTIC ACIDOPHILUS BEADS PO) Take by mouth daily.     QUERCETIN PO Take by mouth.     vitamin E 400  UNIT/15ML LIQD      No current facility-administered medications for this visit.    Allergies as of 07/06/2023   (No Known Allergies)    Family History  Problem Relation Age of Onset   Hypertension Mother    Osteoporosis Mother    Alzheimer's disease Mother    Cancer Sister        ? unsure if she does   Hypertension Sister    Osteopenia Father    Heart attack Maternal Grandfather    Osteoporosis Maternal Aunt    Osteoporosis Maternal Grandmother    Osteoporosis Paternal Grandmother    Heart disease Paternal Grandfather    Breast cancer Neg Hx    Colon cancer Neg Hx    Colon polyps Neg Hx    Esophageal cancer Neg Hx    Rectal cancer Neg Hx    Stomach cancer Neg Hx     Social History   Socioeconomic History   Marital status: Married    Spouse name: Not on file   Number of children: Not on file   Years of education: Not on file   Highest education level: Not on file  Occupational History   Not on file  Tobacco Use   Smoking  status: Never   Smokeless tobacco: Never  Vaping Use   Vaping status: Never Used  Substance and Sexual Activity   Alcohol use: Yes    Alcohol/week: 3.0 standard drinks of alcohol    Types: 3 Glasses of wine per week   Drug use: No   Sexual activity: Yes    Partners: Male  Other Topics Concern   Not on file  Social History Narrative   Exercises daily - yoga, bike, jump rope, stairs, treadmill   Mental health therapist   Married   No children   Social Drivers of Health   Financial Resource Strain: Low Risk  (09/27/2021)   Received from [facility], [facility]   Overall Financial Resource Strain (CARDIA)    Difficulty of Paying Living Expenses: Not hard at all  Food Insecurity: No Food Insecurity (09/27/2021)   Received from [facility], [facility]   Hunger Vital Sign    Worried About Running Out of Food in the Last Year: Never true    Ran Out of Food in the Last Year: Never true  Transportation Needs: Not on file  Physical Activity: Sufficiently Active (09/27/2021)   Received from [facility], [facility]   Exercise Vital Sign    Days of Exercise per Week: 7 days    Minutes of Exercise per Session: 30 min  Stress: No Stress Concern Present (09/27/2021)   Received from [facility], [facility] - Occupational Stress Questionnaire    Feeling of Stress : Only a little  Social Connections: Unknown (09/29/2022)   Received from [facility]   Social Network    Social Network: Not on file  Intimate Partner Violence: Unknown (09/29/2022)   Received from [facility], [facility]   HITS    Physically Hurt: Not on file    Insult or Talk Down To: Not on file    Threaten Physical Harm: Not on file    Scream or Curse: Not on file    Review of Systems:  All other review of systems negative except as mentioned in the HPI.  Physical Exam: Vital signs LMP 02/16/2007   General:   Alert,  Well-developed,  well-nourished, pleasant and cooperative in  NAD Lungs:  Clear throughout to auscultation.   Heart:  Regular rate and rhythm; no murmurs, clicks, rubs,  or gallops. Abdomen:  Soft, nontender and nondistended. Normal bowel sounds.   Neuro/Psych:  Alert and cooperative. Normal mood and affect. A and O x 3   @Lounell Schumacher  Sena Slate, MD, [facility] [phone] (pager) 07/06/2023 8:55 AM@

## 2023-07-06 NOTE — Progress Notes (Signed)
 Report given to PACU, vss

## 2023-07-06 NOTE — Patient Instructions (Addendum)
 No polyps today.  Your next routine colonoscopy should be in 5 years - 2030.  I appreciate the opportunity to care for you. Iva Boop, MD, FACG   YOU HAD AN ENDOSCOPIC PROCEDURE TODAY AT THE [facility]:   Refer to the procedure report that was given to you for any specific questions about what was found during the examination.  If the procedure report does not answer your questions, please call your gastroenterologist to clarify.  If you requested that your care partner not be given the details of your procedure findings, then the procedure report has been included in a sealed envelope for you to review at your convenience later.  YOU SHOULD EXPECT: Some feelings of bloating in the abdomen. Passage of more gas than usual.  Walking can help get rid of the air that was put into your GI tract during the procedure and reduce the bloating. If you had a lower endoscopy (such as a colonoscopy or flexible sigmoidoscopy) you may notice spotting of blood in your stool or on the toilet paper. If you underwent a bowel prep for your procedure, you may not have a normal bowel movement for a few days.  Please Note:  You might notice some irritation and congestion in your nose or some drainage.  This is from the oxygen used during your procedure.  There is no need for concern and it should clear up in a day or so.  SYMPTOMS TO REPORT IMMEDIATELY:  Following lower endoscopy (colonoscopy or flexible sigmoidoscopy):  Excessive amounts of blood in the stool  Significant tenderness or worsening of abdominal pains  Swelling of the abdomen that is new, acute  Fever of 100F or higher   For urgent or emergent issues, a gastroenterologist can be reached at any hour by calling [phone]. Do not use MyChart messaging for urgent concerns.    DIET:  We do recommend a small meal at first, but then you may proceed to your regular diet.  Drink plenty of fluids but you should avoid alcoholic  beverages for 24 hours.  ACTIVITY:  You should plan to take it easy for the rest of today and you should NOT DRIVE or use heavy machinery until tomorrow (because of the sedation medicines used during the test).    FOLLOW UP: Our staff will call the number listed on your records the next business day following your procedure.  We will call around 7:15- 8:00 am to check on you and address any questions or concerns that you may have regarding the information given to you following your procedure. If we do not reach you, we will leave a message.     If any biopsies were taken you will be contacted by phone or by letter within the next 1-3 weeks.  Please call us at [phone] if you have not heard about the biopsies in 3 weeks.    SIGNATURES/CONFIDENTIALITY: You and/or your care partner have signed paperwork which will be entered into your electronic medical record.  These signatures attest to the fact that that the information above on your After Visit Summary has been reviewed and is understood.  Full responsibility of the confidentiality of this discharge information lies with you and/or your care-partner.

## 2023-07-06 NOTE — Progress Notes (Signed)
 Pt's states no medical or surgical changes since previsit or office visit.

## 2023-07-06 NOTE — Op Note (Signed)
 [facility] Patient Name: Sara Potter Procedure Date: 07/06/2023 10:16 AM MRN: [id number] Endoscopist: Iva Boop , MD, [id number] Age: 53 Referring MD:  Date of Birth: 01/15/1971 Gender: Female Account #: [id number] Procedure:                Colonoscopy Indications:              Surveillance: Personal history of adenomatous                            polyps on last colonoscopy 3 years ago, Last                            colonoscopy: 2022 Medicines:                Monitored Anesthesia Care Procedure:                Pre-Anesthesia Assessment:                           - Prior to the procedure, a History and Physical                            was performed, and patient medications and                            allergies were reviewed. The patient's tolerance of                            previous anesthesia was also reviewed. The risks                            and benefits of the procedure and the sedation                            options and risks were discussed with the patient.                            All questions were answered, and informed consent                            was obtained. Prior Anticoagulants: The patient has                            taken no anticoagulant or antiplatelet agents. ASA                            Grade Assessment: I - A normal, healthy patient.                            After reviewing the risks and benefits, the patient                            was deemed in satisfactory condition to undergo the  procedure.                           After obtaining informed consent, the colonoscope                            was passed under direct vision. Throughout the                            procedure, the patient's blood pressure, pulse, and                            oxygen saturations were monitored continuously. The                            [id number]-HQ190L Colonoscope [id number] was introduced                             through the anus and advanced to the the cecum,                            identified by appendiceal orifice and ileocecal                            valve. The colonoscopy was performed without                            difficulty. The patient tolerated the procedure                            well. The quality of the bowel preparation was                            good. The bowel preparation used was SUFLAVE via                            split dose instruction. The ileocecal valve,                            appendiceal orifice, and rectum were photographed. Scope In: 10:28:43 AM Scope Out: 10:41:40 AM Scope Withdrawal Time: 0 hours 9 minutes 28 seconds  Total Procedure Duration: 0 hours 12 minutes 57 seconds  Findings:                 The perianal and digital rectal examinations were                            normal.                           The entire examined colon appeared normal on direct                            and retroflexion views. Complications:            No immediate complications. Estimated Blood Loss:  Estimated blood loss: none. Impression:               - The entire examined colon is normal on direct and                            retroflexion views.                           - No specimens collected.                           - Personal history of colonic polyps. 2 diminutive                            adenomas and 2 diminutive ssp's 2022 Recommendation:           - Patient has a contact number available for                            emergencies. The signs and symptoms of potential                            delayed complications were discussed with the                            patient. Return to normal activities tomorrow.                            Written discharge instructions were provided to the                            patient.                           - Resume previous diet.                           - Continue present  medications.                           - Repeat colonoscopy in 5 years for surveillance.                            Dr. Irena Reichmann, MD 07/06/2023 10:49:17 AM This report has been signed electronically.

## 2023-07-09 ENCOUNTER — Telehealth: Payer: Self-pay

## 2023-07-09 NOTE — Telephone Encounter (Signed)
 No answer, left message to call if having any issues or concerns, B.Vale Mousseau RN

## 2023-07-11 ENCOUNTER — Other Ambulatory Visit (HOSPITAL_COMMUNITY): Payer: Self-pay

## 2023-07-11 ENCOUNTER — Other Ambulatory Visit: Payer: Self-pay

## 2023-07-12 ENCOUNTER — Other Ambulatory Visit (HOSPITAL_COMMUNITY): Payer: Self-pay

## 2023-08-24 DIAGNOSIS — H40023 Open angle with borderline findings, high risk, bilateral: Secondary | ICD-10-CM | POA: Diagnosis not present

## 2023-10-10 ENCOUNTER — Encounter: Payer: Self-pay | Admitting: Internal Medicine

## 2023-10-12 ENCOUNTER — Other Ambulatory Visit (HOSPITAL_COMMUNITY): Payer: Self-pay

## 2023-10-15 ENCOUNTER — Other Ambulatory Visit (HOSPITAL_COMMUNITY): Payer: Self-pay

## 2023-10-15 ENCOUNTER — Other Ambulatory Visit (HOSPITAL_BASED_OUTPATIENT_CLINIC_OR_DEPARTMENT_OTHER): Payer: Self-pay

## 2023-12-05 ENCOUNTER — Encounter: Payer: Self-pay | Admitting: Internal Medicine

## 2023-12-05 NOTE — Progress Notes (Unsigned)
 Subjective:    Patient ID: Sara Potter, female    DOB: 08/20/1970, 53 y.o.   MRN: [id number]      HPI Sara Potter is here for a Physical exam and her chronic medical problems.   Doing well.   A few skin spots on her face and shoulder blade she would like looked at.     Medications and allergies reviewed with patient and updated if appropriate.  Current Outpatient Medications on File Prior to Visit  Medication Sig Dispense Refill   Acetylcysteine (NAC PO) Take by mouth.     b complex vitamins tablet Take 1 tablet by mouth daily.     Black Cohosh 450 MG CAPS      Calcium Carbonate-Vit D-Min (CALCIUM 1200 PO)      Cholecalciferol (VITAMIN D3 PO) Take by mouth.     Docosahexaenoic Acid (DHA FROM ALGAE) 200 MG CAPS      estradiol  (ESTRING ) 7.5 MCG/24HR vaginal ring Insert 1 ring vaginally every 3 months as directed. 1 each 3   Ginkgo 60 MG TABS      GINSENG CHINESE RED PO      IRON PO Take by mouth daily.     Multiple Vitamin (MULTIVITAMIN) tablet Take 1 tablet by mouth daily.     Probiotic Product (PROBIOTIC ACIDOPHILUS BEADS PO) Take by mouth daily.     QUERCETIN PO Take by mouth.     vitamin E 400 UNIT/15ML LIQD      No current facility-administered medications on file prior to visit.    Review of Systems  Constitutional:  Negative for fever.  Eyes:  Negative for visual disturbance.  Respiratory:  Negative for cough, shortness of breath and wheezing.   Cardiovascular:  Negative for chest pain, palpitations and leg swelling.  Gastrointestinal:  Positive for constipation (occ). Negative for abdominal pain, blood in stool and diarrhea.       No gerd  Genitourinary:  Negative for dysuria.  Musculoskeletal:  Positive for back pain (occasional). Negative for arthralgias.  Skin:  Negative for rash.  Neurological:  Positive for headaches (increased). Negative for dizziness and light-headedness.  Psychiatric/Behavioral:  Negative for dysphoric mood. The patient is not  nervous/anxious.        Objective:   Vitals:   12/06/23 0822  BP: 108/68  Pulse: 67  Temp: 97.9 F (36.6 C)  SpO2: 97%   Filed Weights   12/06/23 0822  Weight: 121 lb (54.9 kg)   Body mass index is 23.63 kg/m.  BP Readings from Last 3 Encounters:  12/06/23 108/68  07/06/23 113/70  06/18/23 126/80    Wt Readings from Last 3 Encounters:  12/06/23 121 lb (54.9 kg)  07/06/23 120 lb (54.4 kg)  06/18/23 124 lb (56.2 kg)       Physical Exam Constitutional: She appears well-developed and well-nourished. No distress.  HENT:  Head: Normocephalic and atraumatic.  Right Ear: External ear normal. Normal ear canal and TM Left Ear: External ear normal.  Normal ear canal and TM Mouth/Throat: Oropharynx is clear and moist.  Eyes: Conjunctivae normal.  Neck: Neck supple. No tracheal deviation present. No thyromegaly present.  No carotid bruit  Cardiovascular: Normal rate, regular rhythm and normal heart sounds.   No murmur heard.  No edema. Pulmonary/Chest: Effort normal and breath sounds normal. No respiratory distress. She has no wheezes. She has no rales.  Breast: deferred   Abdominal: Soft. She exhibits no distension. There is no tenderness.  Lymphadenopathy: She has no  cervical adenopathy.  Skin: Skin is warm and dry. She is not diaphoretic.  Normal appearing moles. Psychiatric: She has a normal mood and affect. Her behavior is normal.     Lab Results  Component Value Date   WBC 4.1 11/20/2022   HGB 13.0 11/20/2022   HCT 38.1 11/20/2022   PLT 268.0 11/20/2022   GLUCOSE 87 11/20/2022   CHOL 200 11/20/2022   TRIG 78.0 11/20/2022   HDL 91.60 11/20/2022   LDLCALC 92 11/20/2022   ALT 14 11/20/2022   AST 20 11/20/2022   NA 139 11/20/2022   K 4.0 11/20/2022   CL 103 11/20/2022   CREATININE 0.69 11/20/2022   BUN 7 11/20/2022   CO2 27 11/20/2022   TSH 2.31 11/20/2022         Assessment & Plan:   Physical exam: Screening blood work  ordered Exercise   regular - aerobics, stair step, yoga, weights, rowing Weight  normal Substance abuse  none Discussed skin cancer screening with dermatology  Reviewed recommended immunizations.   Health Maintenance  Topic Date Due   Hepatitis B Vaccines 19-59 Average Risk (1 of 3 - 19+ 3-dose series) Never done   Pneumococcal Vaccine: 50+ Years (1 of 1 - PCV) Never done   COVID-19 Vaccine (4 - 2024-25 season) 12/17/2022   INFLUENZA VACCINE  11/16/2023   MAMMOGRAM  02/16/2024   DTaP/Tdap/Td (2 - Td or Tdap) 09/24/2024   DEXA SCAN  05/03/2025   Cervical Cancer Screening (HPV/Pap Cotest)  05/04/2027   Colonoscopy  07/05/2028   Hepatitis C Screening  Completed   HIV Screening  Completed   HPV VACCINES  Aged Out   Meningococcal B Vaccine  Aged Out   Zoster Vaccines- Shingrix  Discontinued          See Problem List for Assessment and Plan of chronic medical problems.

## 2023-12-05 NOTE — Patient Instructions (Addendum)
 Blood work was ordered.       Medications changes include :   None   [facility] on [facility] [phone])- all women [facility] general dermatology on [place]. [address] [phone]),  [facility] [phone]) [facility] [phone])    Return in about 1 year (around 12/05/2024) for Physical Exam.   Health Maintenance, Female Adopting a healthy lifestyle and getting preventive care are important in promoting health and wellness. Ask your health care provider about: The right schedule for you to have regular tests and exams. Things you can do on your own to prevent diseases and keep yourself healthy. What should I know about diet, weight, and exercise? Eat a healthy diet  Eat a diet that includes plenty of vegetables, fruits, low-fat dairy products, and lean protein. Do not eat a lot of foods that are high in solid fats, added sugars, or sodium. Maintain a healthy weight Body mass index (BMI) is used to identify weight problems. It estimates body fat based on height and weight. Your health care provider can help determine your BMI and help you achieve or maintain a healthy weight. Get regular exercise Get regular exercise. This is one of the most important things you can do for your health. Most adults should: Exercise for at least 150 minutes each week. The exercise should increase your heart rate and make you sweat (moderate-intensity exercise). Do strengthening exercises at least twice a week. This is in addition to the moderate-intensity exercise. Spend less time sitting. Even light physical activity can be beneficial. Watch cholesterol and blood lipids Have your blood tested for lipids and cholesterol at 54 years of age, then have this test every 5 years. Have your cholesterol levels checked more often if: Your lipid or cholesterol levels are high. You are older than 54 years of age. You are at high  risk for heart disease. What should I know about cancer screening? Depending on your health history and family history, you may need to have cancer screening at various ages. This may include screening for: Breast cancer. Cervical cancer. Colorectal cancer. Skin cancer. Lung cancer. What should I know about heart disease, diabetes, and high blood pressure? Blood pressure and heart disease High blood pressure causes heart disease and increases the risk of stroke. This is more likely to develop in people who have high blood pressure readings or are overweight. Have your blood pressure checked: Every 3-5 years if you are 49-69 years of age. Every year if you are 52 years old or older. Diabetes Have regular diabetes screenings. This checks your fasting blood sugar level. Have the screening done: Once every three years after age 63 if you are at a normal weight and have a low risk for diabetes. More often and at a younger age if you are overweight or have a high risk for diabetes. What should I know about preventing infection? Hepatitis B If you have a higher risk for hepatitis B, you should be screened for this virus. Talk with your health care provider to find out if you are at risk for hepatitis B infection. Hepatitis C Testing is recommended for: Everyone born from [birth year] through 1966. Anyone with known risk factors for hepatitis C. Sexually transmitted infections (STIs) Get screened for STIs, including gonorrhea and chlamydia, if: You are sexually active and are younger than 54 years of age. You are older than 54 years of age and your health care provider tells you that  you are at risk for this type of infection. Your sexual activity has changed since you were last screened, and you are at increased risk for chlamydia or gonorrhea. Ask your health care provider if you are at risk. Ask your health care provider about whether you are at high risk for HIV. Your health care provider may  recommend a prescription medicine to help prevent HIV infection. If you choose to take medicine to prevent HIV, you should first get tested for HIV. You should then be tested every 3 months for as long as you are taking the medicine. Pregnancy If you are about to stop having your period (premenopausal) and you may become pregnant, seek counseling before you get pregnant. Take 400 to 800 micrograms (mcg) of folic acid every day if you become pregnant. Ask for birth control (contraception) if you want to prevent pregnancy. Osteoporosis and menopause Osteoporosis is a disease in which the bones lose minerals and strength with aging. This can result in bone fractures. If you are 27 years old or older, or if you are at risk for osteoporosis and fractures, ask your health care provider if you should: Be screened for bone loss. Take a calcium or vitamin D  supplement to lower your risk of fractures. Be given hormone replacement therapy (HRT) to treat symptoms of menopause. Follow these instructions at home: Alcohol use Do not drink alcohol if: Your health care provider tells you not to drink. You are pregnant, may be pregnant, or are planning to become pregnant. If you drink alcohol: Limit how much you have to: 0-1 drink a day. Know how much alcohol is in your drink. In the [place] one drink equals one 12 oz bottle of beer (355 mL), one 5 oz glass of wine (148 mL), or one 1 oz glass of hard liquor (44 mL). Lifestyle Do not use any products that contain nicotine or tobacco. These products include cigarettes, chewing tobacco, and vaping devices, such as e-cigarettes. If you need help quitting, ask your health care provider. Do not use street drugs. Do not share needles. Ask your health care provider for help if you need support or information about quitting drugs. General instructions Schedule regular health, dental, and eye exams. Stay current with your vaccines. Tell your health care provider  if: You often feel depressed. You have ever been abused or do not feel safe at home. Summary Adopting a healthy lifestyle and getting preventive care are important in promoting health and wellness. Follow your health care provider's instructions about healthy diet, exercising, and getting tested or screened for diseases. Follow your health care provider's instructions on monitoring your cholesterol and blood pressure. This information is not intended to replace advice given to you by your health care provider. Make sure you discuss any questions you have with your health care provider. Document Revised: 08/23/2020 Document Reviewed: 08/23/2020 Elsevier Patient Education  2024 [employer].

## 2023-12-06 ENCOUNTER — Ambulatory Visit: Admitting: Internal Medicine

## 2023-12-06 ENCOUNTER — Ambulatory Visit: Payer: Self-pay | Admitting: Internal Medicine

## 2023-12-06 VITALS — BP 108/68 | HR 67 | Temp 97.9°F | Ht 60.0 in | Wt 121.0 lb

## 2023-12-06 DIAGNOSIS — E559 Vitamin D deficiency, unspecified: Secondary | ICD-10-CM

## 2023-12-06 DIAGNOSIS — Z0000 Encounter for general adult medical examination without abnormal findings: Secondary | ICD-10-CM | POA: Diagnosis not present

## 2023-12-06 DIAGNOSIS — Z13220 Encounter for screening for lipoid disorders: Secondary | ICD-10-CM | POA: Diagnosis not present

## 2023-12-06 DIAGNOSIS — M8580 Other specified disorders of bone density and structure, unspecified site: Secondary | ICD-10-CM

## 2023-12-06 LAB — CBC WITH DIFFERENTIAL/PLATELET
Basophils Absolute: 0.1 K/uL (ref 0.0–0.1)
Basophils Relative: 2.3 % (ref 0.0–3.0)
Eosinophils Absolute: 0 K/uL (ref 0.0–0.7)
Eosinophils Relative: 1 % (ref 0.0–5.0)
HCT: 40.1 % (ref 36.0–46.0)
Hemoglobin: 13.4 g/dL (ref 12.0–15.0)
Lymphocytes Relative: 26.3 % (ref 12.0–46.0)
Lymphs Abs: 1.2 K/uL (ref 0.7–4.0)
MCHC: 33.5 g/dL (ref 30.0–36.0)
MCV: 90.8 fl (ref 78.0–100.0)
Monocytes Absolute: 0.4 K/uL (ref 0.1–1.0)
Monocytes Relative: 8.6 % (ref 3.0–12.0)
Neutro Abs: 2.8 K/uL (ref 1.4–7.7)
Neutrophils Relative %: 61.8 % (ref 43.0–77.0)
Platelets: 296 K/uL (ref 150.0–400.0)
RBC: 4.41 Mil/uL (ref 3.87–5.11)
RDW: 13.3 % (ref 11.5–15.5)
WBC: 4.6 K/uL (ref 4.0–10.5)

## 2023-12-06 LAB — COMPREHENSIVE METABOLIC PANEL WITH GFR
ALT: 14 U/L (ref 0–35)
AST: 19 U/L (ref 0–37)
Albumin: 4.6 g/dL (ref 3.5–5.2)
Alkaline Phosphatase: 58 U/L (ref 39–117)
BUN: 10 mg/dL (ref 6–23)
CO2: 27 meq/L (ref 19–32)
Calcium: 9.2 mg/dL (ref 8.4–10.5)
Chloride: 102 meq/L (ref 96–112)
Creatinine, Ser: 0.69 mg/dL (ref 0.40–1.20)
GFR: 98.85 mL/min (ref >60.00)
Glucose, Bld: 85 mg/dL (ref 70–99)
Potassium: 4.3 meq/L (ref 3.5–5.1)
Sodium: 139 meq/L (ref 135–145)
Total Bilirubin: 0.5 mg/dL (ref 0.2–1.2)
Total Protein: 7.1 g/dL (ref 6.0–8.3)

## 2023-12-06 LAB — LIPID PANEL
Cholesterol: 208 mg/dL — ABNORMAL HIGH (ref 0–200)
HDL: 104.8 mg/dL (ref >39.00)
LDL Cholesterol: 94 mg/dL (ref 0–99)
NonHDL: 103.2
Total CHOL/HDL Ratio: 2
Triglycerides: 48 mg/dL (ref 0.0–149.0)
VLDL: 9.6 mg/dL (ref 0.0–40.0)

## 2023-12-06 LAB — VITAMIN D 25 HYDROXY (VIT D DEFICIENCY, FRACTURES): VITD: 71.64 ng/mL (ref 30.00–100.00)

## 2023-12-06 LAB — TSH: TSH: 2.12 u[IU]/mL (ref 0.35–5.50)

## 2023-12-06 NOTE — Assessment & Plan Note (Signed)
 Chronic Dexa up to date - monitored by Dr Nikki Continue regular exercise Continue calcium and vitamin d  supplementation Check vitamin d  level, cmp, cbc, tsh

## 2023-12-06 NOTE — Assessment & Plan Note (Signed)
Chronic Taking vitamin D daily Check vitamin D level, CMP

## 2024-01-02 ENCOUNTER — Other Ambulatory Visit: Payer: Self-pay | Admitting: Internal Medicine

## 2024-01-02 DIAGNOSIS — Z1231 Encounter for screening mammogram for malignant neoplasm of breast: Secondary | ICD-10-CM

## 2024-01-08 ENCOUNTER — Other Ambulatory Visit (HOSPITAL_BASED_OUTPATIENT_CLINIC_OR_DEPARTMENT_OTHER): Payer: Self-pay

## 2024-01-12 ENCOUNTER — Other Ambulatory Visit (HOSPITAL_COMMUNITY): Payer: Self-pay

## 2024-01-12 ENCOUNTER — Other Ambulatory Visit: Payer: Self-pay

## 2024-01-14 ENCOUNTER — Other Ambulatory Visit (HOSPITAL_COMMUNITY): Payer: Self-pay

## 2024-01-14 ENCOUNTER — Other Ambulatory Visit (HOSPITAL_BASED_OUTPATIENT_CLINIC_OR_DEPARTMENT_OTHER): Payer: Self-pay

## 2024-01-14 ENCOUNTER — Other Ambulatory Visit: Payer: Self-pay

## 2024-01-15 ENCOUNTER — Other Ambulatory Visit (HOSPITAL_COMMUNITY): Payer: Self-pay

## 2024-02-20 ENCOUNTER — Encounter: Payer: Self-pay | Admitting: Internal Medicine

## 2024-02-22 ENCOUNTER — Ambulatory Visit
Admission: RE | Admit: 2024-02-22 | Discharge: 2024-02-22 | Disposition: A | Discharge status: Home / Self Care | Source: Ambulatory Visit | Attending: Internal Medicine | Admitting: Internal Medicine

## 2024-02-22 DIAGNOSIS — Z1231 Encounter for screening mammogram for malignant neoplasm of breast: Secondary | ICD-10-CM | POA: Diagnosis not present

## 2024-02-26 ENCOUNTER — Ambulatory Visit: Payer: Self-pay | Admitting: Obstetrics and Gynecology

## 2024-04-04 DIAGNOSIS — H40013 Open angle with borderline findings, low risk, bilateral: Secondary | ICD-10-CM | POA: Diagnosis not present

## 2024-04-07 ENCOUNTER — Other Ambulatory Visit (HOSPITAL_COMMUNITY): Payer: Self-pay

## 2024-04-08 ENCOUNTER — Other Ambulatory Visit (HOSPITAL_COMMUNITY): Payer: Self-pay

## 2024-04-09 ENCOUNTER — Other Ambulatory Visit (HOSPITAL_COMMUNITY): Payer: Self-pay

## 2024-05-02 ENCOUNTER — Encounter: Payer: Self-pay | Admitting: Internal Medicine

## 2024-05-02 ENCOUNTER — Ambulatory Visit: Admitting: Internal Medicine

## 2024-05-02 VITALS — BP 106/72 | HR 63 | Temp 98.0°F | Ht 60.0 in | Wt 125.0 lb

## 2024-05-02 DIAGNOSIS — M25561 Pain in right knee: Secondary | ICD-10-CM

## 2024-05-02 NOTE — Patient Instructions (Signed)
" ° ° ° °  Make an with sports medicine downstairs - right knee pain.     Medications changes include :   None    A referral was ordered for sports medicine and someone will call you to schedule an appointment.     "

## 2024-05-02 NOTE — Progress Notes (Signed)
 "   Subjective:    Patient ID: Sara Potter, female    DOB: 08/24/1970, 54 y.o.   MRN: [id number]      HPI Sara Potter is here for  Chief Complaint  Patient presents with   Knee Pain    Right knee pain    Discussed the use of AI scribe software for clinical note transcription with the patient, who gave verbal consent to proceed.  History of Present Illness Sara Potter is a 54 year old female who presents with right knee pain.  She has been experiencing right knee pain for an extended period, with occasional involvement of the left knee, though less severe. Initially, the pain was associated with running, which she did infrequently, but it would take months to recover. Recently, the pain has persisted, with some days being better than others. The pain is primarily located in the front of the knee, but sometimes affects the back as well.  The knee pain is exacerbated by activities such as walking her active dog and going up and down stairs, which is a daily occurrence due to the presence of steps in her house. She has not noticed any swelling in the knee, though she is aware that swelling can occur internally without being visible.  She has been taking Aleve since Sunday night, which has significantly alleviated the pain. She has also adjusted her stretching routine, focusing on exercises beneficial for her symptoms. Activities such as yard work, particularly bending and picking up leaves, significantly aggravate her knee pain.  No numbness, tingling, or calf pain. She has stopped running due to the prolonged recovery time and is currently engaging in other forms of cardio workouts.       Medications and allergies reviewed with patient and updated if appropriate.  Medications Ordered Prior to Encounter[1]  Review of Systems     Objective:   Vitals:   05/02/24 1428  BP: 106/72  Pulse: 63  Temp: 98 F (36.7 C)  SpO2: 99%   BP Readings from Last 3 Encounters:   05/02/24 106/72  12/06/23 108/68  07/06/23 113/70   Wt Readings from Last 3 Encounters:  05/02/24 125 lb (56.7 kg)  12/06/23 121 lb (54.9 kg)  07/06/23 120 lb (54.4 kg)   Body mass index is 24.41 kg/m.    Physical Exam Constitutional:      General: She is not in acute distress.    Appearance: Normal appearance. She is not ill-appearing.  HENT:     Head: Normocephalic and atraumatic.  Musculoskeletal:        General: Tenderness (Slight tenderness with palpation right knee medial, lower aspect and slight tenderness patellar tibial tendon) present. No swelling or deformity. Normal range of motion.     Right lower leg: No edema.     Left lower leg: No edema.     Comments: No right calf pain.  Possible slightly positive McMurry test  Skin:    General: Skin is warm and dry.     Findings: No bruising, erythema or rash.  Neurological:     Mental Status: She is alert.            Assessment & Plan:       Assessment and Plan Assessment & Plan Right knee pain Chronic pain, likely inflammatory. Differential includes arthritis, patellofemoral syndrome, ligament or meniscus issues. - Referred to sports medicine for evaluation, possible x-ray and ultrasound. - Continue Aleve as needed. - Recommend icing and stretching exercises. - Avoid  running until sports medicine evaluation.       [1]  Current Outpatient Medications on File Prior to Visit  Medication Sig Dispense Refill   Acetylcysteine (NAC PO) Take by mouth.     b complex vitamins tablet Take 1 tablet by mouth daily.     Black Cohosh 450 MG CAPS      Calcium Carbonate-Vit D-Min (CALCIUM 1200 PO)      Cholecalciferol (VITAMIN D3 PO) Take by mouth.     Docosahexaenoic Acid (DHA FROM ALGAE) 200 MG CAPS      estradiol  (ESTRING ) 7.5 MCG/24HR vaginal ring Insert 1 ring vaginally every 3 months as directed. 1 each 3   Ginkgo 60 MG TABS      GINSENG CHINESE RED PO      IRON PO Take by mouth daily.     Multiple  Vitamin (MULTIVITAMIN) tablet Take 1 tablet by mouth daily.     Probiotic Product (PROBIOTIC ACIDOPHILUS BEADS PO) Take by mouth daily.     QUERCETIN PO Take by mouth.     vitamin E 400 UNIT/15ML LIQD      No current facility-administered medications on file prior to visit.   "

## 2024-05-16 ENCOUNTER — Ambulatory Visit: Admitting: Sports Medicine

## 2024-05-16 ENCOUNTER — Ambulatory Visit

## 2024-05-16 ENCOUNTER — Other Ambulatory Visit (HOSPITAL_COMMUNITY): Payer: Self-pay

## 2024-05-16 VITALS — HR 85 | Ht 60.0 in | Wt 125.0 lb

## 2024-05-16 DIAGNOSIS — G8929 Other chronic pain: Secondary | ICD-10-CM | POA: Diagnosis not present

## 2024-05-16 DIAGNOSIS — M25561 Pain in right knee: Secondary | ICD-10-CM

## 2024-05-16 MED ORDER — MELOXICAM 15 MG PO TABS
ORAL_TABLET | ORAL | 0 refills | Status: AC
  Filled 2024-05-16: qty 30, 30d supply, fill #0

## 2024-05-20 ENCOUNTER — Ambulatory Visit: Payer: Self-pay | Admitting: Sports Medicine

## 2024-06-13 ENCOUNTER — Ambulatory Visit: Admitting: Sports Medicine

## 2024-06-18 ENCOUNTER — Ambulatory Visit: Admitting: Obstetrics and Gynecology

## 2024-12-08 ENCOUNTER — Encounter: Admitting: Internal Medicine
